# Patient Record
Sex: Female | Born: 2000 | Race: Black or African American | Hispanic: No | Smoking: Former smoker
Health system: Southern US, Community
[De-identification: ages and names within clinical notes are randomized; demographics above are authoritative.]

## PROBLEM LIST (undated history)

## (undated) DIAGNOSIS — M419 Scoliosis, unspecified: Secondary | ICD-10-CM

## (undated) DIAGNOSIS — O139 Gestational [pregnancy-induced] hypertension without significant proteinuria, unspecified trimester: Secondary | ICD-10-CM

## (undated) DIAGNOSIS — F32A Depression, unspecified: Secondary | ICD-10-CM

---
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
## 2020-02-09 ENCOUNTER — Other Ambulatory Visit: Payer: Self-pay

## 2020-02-09 ENCOUNTER — Ambulatory Visit (HOSPITAL_COMMUNITY)
Admission: EM | Admit: 2020-02-09 | Discharge: 2020-02-09 | Disposition: A | Discharge status: Home / Self Care | Payer: Medicaid Other | Attending: Physician Assistant | Admitting: Physician Assistant

## 2020-02-09 ENCOUNTER — Encounter (HOSPITAL_COMMUNITY): Payer: Self-pay | Admitting: Emergency Medicine

## 2020-02-09 DIAGNOSIS — M778 Other enthesopathies, not elsewhere classified: Secondary | ICD-10-CM

## 2020-02-09 MED ORDER — IBUPROFEN 600 MG PO TABS: 600.0000 mg | ORAL_TABLET | Freq: Four times a day (QID) | ORAL | 0 refills | Status: DC | PRN

## 2020-02-09 NOTE — ED Triage Notes (Signed)
Patient has been bowling for the past 2 weeks. Has noticed pain with these activities.  Pain in right middle finger.  Patient says she can move finger, but something does not feel right.  No known injury

## 2020-02-09 NOTE — ED Provider Notes (Signed)
[facility]    CSN: [id number] Arrival date & time: 02/09/20  1800      History   Chief Complaint Chief Complaint  Patient presents with  . Hand Pain    HPI Julia Phillips is a 19 y.o. female.   Patient reports her right middle finger discomfort.  She reports she has noticed this after bowling more recently.  She reports she believes there is mild swelling.  She is concerned was going on.  Denies numbness or tingling.  She has not tried any medicines for this.     Past Medical History:  Diagnosis Date  . Scoliosis     There are no problems to display for this patient.   History reviewed. No pertinent surgical history.  OB History    Gravida  0   Para  0   Term  0   Preterm  0   AB  0   Living  0     SAB  0   TAB  0   Ectopic  0   Multiple  0   Live Births  0            Home Medications    Prior to Admission medications   Medication Sig Start Date End Date Taking? Authorizing Provider  ibuprofen (ADVIL) 600 MG tablet Take 1 tablet (600 mg total) by mouth every 6 (six) hours as needed for moderate pain. 02/09/20   Lincoln Ginley, Veryl Speak, PA-C    Family History Family History  Problem Relation Age of Onset  . Diabetes Paternal Grandmother     Social History Social History   Tobacco Use  . Smoking status: Passive Smoke Exposure - Never Smoker  . Smokeless tobacco: Never Used  Substance Use Topics  . Alcohol use: Yes    Comment: rare  . Drug use: Yes    Types: Marijuana     Allergies   Patient has no known allergies.   Review of Systems Review of Systems   Physical Exam Triage Vital Signs ED Triage Vitals  Enc Vitals Group     BP 02/09/20 1849 139/84     Pulse Rate 02/09/20 1849 76     Resp 02/09/20 1849 18     Temp 02/09/20 1849 98.5 F (36.9 C)     Temp Source 02/09/20 1849 Oral     SpO2 02/09/20 1849 100 %     Weight --      Height --      Head Circumference --      Peak Flow --      Pain Score  02/09/20 1847 6     Pain Loc --      Pain Edu? --      Excl. in GC? --    No data found.  Updated Vital Signs BP 139/84 (BP Location: Right Arm)   Pulse 76   Temp 98.5 F (36.9 C) (Oral)   Resp 18   SpO2 100%   Visual Acuity Right Eye Distance:   Left Eye Distance:   Bilateral Distance:    Right Eye Near:   Left Eye Near:    Bilateral Near:     Physical Exam Vitals and nursing note reviewed.  Constitutional:      Appearance: Normal appearance.  Musculoskeletal:     Comments: Right middle finger with minimal to mild swelling about the PIP.  There is tenderness on the PIP on the pointer finger side.  Patient has full range  of motion.  Good strength.  PIP is stable with lateral and medial stress.  No crepitus appreciated.  Cap refill less than 2 seconds.  Neurological:     Mental Status: She is alert.      UC Treatments / Results  Labs (all labs ordered are listed, but only abnormal results are displayed) Labs Reviewed - No data to display  EKG   Radiology No results found.  Procedures Procedures (including critical care time)  Medications Ordered in UC Medications - No data to display  Initial Impression / Assessment and Plan / UC Course  I have reviewed the triage vital signs and the nursing notes.  Pertinent labs & imaging results that were available during my care of the patient were reviewed by me and considered in my medical decision making (see chart for details).     #Finger tendinitis Patient is a 19 year old with what appears to be a tendinopathy of the right middle.  Likely secondary to acute overuse bowling.  Reassured patient that I do not think there is anything seriously wrong with the finger and that simple rest and NSAID therapy will solve the problem.  Discussed reducing bowling time and to do basic mobility of the hand.  Discussed sports medicine follow-up if not improving over the next 1 to 2 weeks.  Patient verbalized understanding of  plan of care. -Ibuprofen every 6 hours Final Clinical Impressions(s) / UC Diagnoses   Final diagnoses:  Tendonitis of finger     Discharge Instructions     Take the ibuprofen every 6 hours, or with meals 3 times a day  Ice the finger tonight.  Consider limiting bowling activities  If not improved in next 1-2 weeks, consider sports medicine follow up or with your PCP       ED Prescriptions    Medication Sig Dispense Auth. Provider   ibuprofen (ADVIL) 600 MG tablet Take 1 tablet (600 mg total) by mouth every 6 (six) hours as needed for moderate pain. 30 tablet Oday [name], [name] Beards, PA-C     PDMP not reviewed this encounter.   Purnell Shoemaker, PA-C 02/09/20 2358

## 2020-02-09 NOTE — Discharge Instructions (Signed)
Take the ibuprofen every 6 hours, or with meals 3 times a day  Ice the finger tonight.  Consider limiting bowling activities  If not improved in next 1-2 weeks, consider sports medicine follow up or with your PCP

## 2021-03-04 ENCOUNTER — Encounter (HOSPITAL_COMMUNITY): Payer: Self-pay

## 2021-03-04 ENCOUNTER — Other Ambulatory Visit: Payer: Self-pay

## 2021-03-04 ENCOUNTER — Ambulatory Visit (HOSPITAL_COMMUNITY)
Admission: EM | Admit: 2021-03-04 | Discharge: 2021-03-04 | Disposition: A | Discharge status: Home / Self Care | Payer: Medicaid Other

## 2021-03-04 DIAGNOSIS — M25532 Pain in left wrist: Secondary | ICD-10-CM | POA: Diagnosis not present

## 2021-03-04 DIAGNOSIS — M67432 Ganglion, left wrist: Secondary | ICD-10-CM | POA: Diagnosis not present

## 2021-03-04 NOTE — Discharge Instructions (Addendum)
Please request a consultation for suspected ganglion cyst with [facility].  Do not use any nonsteroidal anti-inflammatories (NSAIDs) like ibuprofen, Motrin, naproxen, Aleve, etc. which are all available over-the-counter.  Please just use Tylenol at a dose of 500mg -650mg  once every 6 hours as needed for your wrist pain, aches.

## 2021-03-04 NOTE — ED Triage Notes (Signed)
Pt presents with intermittent left wrist pain for about a month with no known injury.

## 2021-03-04 NOTE — ED Provider Notes (Signed)
  [facility]   MRN: [id number] DOB: 2000/08/20  Subjective:   Julia Phillips is a 20 y.o. female presenting for 1 month history of persistent left wrist and pain.  Denies fall, trauma, bruising, warmth, erythema.  She works in [employer], has to use her hands and wrist a lot.  Has not used any particular medications.  Patient is pregnant.  No current facility-administered medications for this encounter.  Current Outpatient Medications:    ibuprofen (ADVIL) 600 MG tablet, Take 1 tablet (600 mg total) by mouth every 6 (six) hours as needed for moderate pain., Disp: 30 tablet, Rfl: 0   No Known Allergies  Past Medical History:  Diagnosis Date   Scoliosis      History reviewed. No pertinent surgical history.  Family History  Problem Relation Age of Onset   Diabetes Paternal Grandmother     Social History   Tobacco Use   Smoking status: Passive Smoke Exposure - Never Smoker   Smokeless tobacco: Never  Substance Use Topics   Alcohol use: Yes    Comment: rare   Drug use: Yes    Types: Marijuana    ROS   Objective:   Vitals: BP 138/81 (BP Location: Left Arm)   Pulse 80   Temp 99.1 F (37.3 C) (Oral)   Resp 18   SpO2 99%   Physical Exam Constitutional:      General: She is not in acute distress.    Appearance: Normal appearance. She is well-developed. She is not ill-appearing, toxic-appearing or diaphoretic.  HENT:     Head: Normocephalic and atraumatic.     Nose: Nose normal.     Mouth/Throat:     Mouth: Mucous membranes are moist.     Pharynx: Oropharynx is clear.  Eyes:     General: No scleral icterus.    Extraocular Movements: Extraocular movements intact.     Pupils: Pupils are equal, round, and reactive to light.  Cardiovascular:     Rate and Rhythm: Normal rate.  Pulmonary:     Effort: Pulmonary effort is normal.  Musculoskeletal:     Left wrist: Swelling (firm ~1/2cm nodule) and tenderness (focal over area outlined)  present. No deformity, effusion, lacerations, bony tenderness, snuff box tenderness or crepitus. Normal range of motion.       Arms:  Skin:    General: Skin is warm.  Neurological:     General: No focal deficit present.     Mental Status: She is alert and oriented to person, place, and time.  Psychiatric:        Mood and Affect: Mood normal.        Behavior: Behavior normal.        Thought Content: Thought content normal.        Judgment: Judgment normal.     Assessment and Plan :   PDMP not reviewed this encounter.  1. Acute pain of left wrist   2. Ganglion of left wrist     Suspect a ganglion cyst of the left wrist. Low suspicion for malignancy, recommended conservative management.  As she is pregnant, will defer imaging, use Tylenol for pain control.  I applied a 2 inch Ace wrap to the left wrist.  Follow-up with orthopedist. Counseled patient on potential for adverse effects with medications prescribed/recommended today, ER and return-to-clinic precautions discussed, patient verbalized understanding.    Wallis Bamberg, PA-C 03/04/21 1044

## 2021-03-28 ENCOUNTER — Telehealth (INDEPENDENT_AMBULATORY_CARE_PROVIDER_SITE_OTHER): Payer: Medicaid Other

## 2021-03-28 DIAGNOSIS — O219 Vomiting of pregnancy, unspecified: Secondary | ICD-10-CM

## 2021-03-28 DIAGNOSIS — M419 Scoliosis, unspecified: Secondary | ICD-10-CM

## 2021-03-28 DIAGNOSIS — Z3401 Encounter for supervision of normal first pregnancy, first trimester: Secondary | ICD-10-CM | POA: Insufficient documentation

## 2021-03-28 DIAGNOSIS — Z3A00 Weeks of gestation of pregnancy not specified: Secondary | ICD-10-CM

## 2021-03-28 MED ORDER — PROMETHAZINE HCL 25 MG PO TABS: 25.0000 mg | ORAL_TABLET | Freq: Four times a day (QID) | ORAL | 0 refills | Status: DC | PRN

## 2021-03-28 MED ORDER — BLOOD PRESSURE KIT DEVI: 1.0000 | Freq: Once | 0 refills | Status: AC

## 2021-03-28 NOTE — Progress Notes (Signed)
New OB Intake  I connected with  Julia Phillips on 03/29/21 at  2:15 PM EDT by MyChart Video Visit and verified that I am speaking with the correct person using two identifiers. Nurse is located at [facility] and pt is located at home.  I discussed the limitations, risks, security and privacy concerns of performing an evaluation and management service by telephone and the availability of in person appointments. I also discussed with the patient that there may be a patient responsible charge related to this service. The patient expressed understanding and agreed to proceed.  I explained I am completing New OB Intake today. We discussed her EDD of 10/09/21 that is based on LMP of 01/02/21. Pt is G1/P0. I reviewed her allergies, medications, Medical/Surgical/OB history, and appropriate screenings. I informed her of [facility] services. Based on history, this is an uncomplicated pregnancy.  Patient Active Problem List   Diagnosis Date Noted   Nausea and vomiting during pregnancy 03/29/2021   Scoliosis 03/29/2021   Supervision of low-risk first pregnancy, first trimester 03/28/2021    Concerns addressed today Nausea: currently smoking marijuana to increase appetite and to treat nausea. Pt would like rx for nausea medication; Phenergan sent per protocol. Explained pt may speak with provider at new OB appt about risk vs benefit of marijuana use during pregnancy.  Concerned about prior vitamin D deficiency.   Delivery Plans:  Plans to deliver at [facility].   MyChart/Babyscripts MyChart access verified. Babyscripts instructions given and order placed. Patient verifies receipt of registration text/e-mail. Account successfully created and app downloaded.  Blood Pressure Cuff  Blood pressure cuff ordered for patient to pick-up from [employer]. Explained after first prenatal appt pt will check weekly and document in Babyscripts.  Weight scale: Has weight scale   Anatomy US Explained first scheduled  [place] will be around 19 weeks. Anatomy US scheduled for 05/15/21 at 0830. Pt notified to arrive at 0815.  Labs Discussed Avelina Laine genetic screening with patient. Would like both Panorama and Horizon drawn at new OB visit. Routine prenatal labs needed.  Covid Vaccine Patient has not covid vaccine.   Mother/ Baby Dyad Candidate?    Yes, patient would like to be contacted for new appt.  Social Determinants of Health Food Insecurity: Patient expresses food insecurity. Food Market information given to patient; explained patient may visit at the end of first OB appointment. WIC Referral: Patient is interested in referral to [facility].  Transportation: Patient denies transportation needs. Childcare: Discussed no children allowed at ultrasound appointments. Offered childcare services; patient declines childcare services at this time.  First visit review I reviewed new OB appt with pt. I explained she will have a visit with provider that includes physical exam and OB labs. Explained pt will be seen by Merian Capron, MD at first visit; encounter routed to appropriate provider. Explained that patient will be seen by pregnancy navigator following visit with provider.  Marjo Bicker, RN 03/29/2021  12:13 PM

## 2021-03-29 DIAGNOSIS — M419 Scoliosis, unspecified: Secondary | ICD-10-CM | POA: Insufficient documentation

## 2021-03-29 DIAGNOSIS — O219 Vomiting of pregnancy, unspecified: Secondary | ICD-10-CM | POA: Insufficient documentation

## 2021-04-04 ENCOUNTER — Other Ambulatory Visit: Payer: Self-pay | Admitting: General Practice

## 2021-04-04 DIAGNOSIS — Z3401 Encounter for supervision of normal first pregnancy, first trimester: Secondary | ICD-10-CM

## 2021-04-04 MED ORDER — BLOOD PRESSURE KIT
1.0000 | PACK | Freq: Once | 0 refills | Status: AC
Start: 2021-04-04 — End: 2021-04-04

## 2021-04-16 ENCOUNTER — Encounter: Payer: Medicaid Other | Admitting: Family Medicine

## 2021-04-23 ENCOUNTER — Other Ambulatory Visit: Payer: Self-pay

## 2021-04-23 ENCOUNTER — Other Ambulatory Visit (HOSPITAL_COMMUNITY)
Admission: RE | Admit: 2021-04-23 | Discharge: 2021-04-23 | Disposition: A | Discharge status: Home / Self Care | Payer: Medicaid Other | Source: Ambulatory Visit | Attending: Family Medicine | Admitting: Family Medicine

## 2021-04-23 ENCOUNTER — Ambulatory Visit (INDEPENDENT_AMBULATORY_CARE_PROVIDER_SITE_OTHER): Payer: Medicaid Other | Admitting: Family Medicine

## 2021-04-23 VITALS — BP 134/81 | HR 86 | Wt 215.1 lb

## 2021-04-23 DIAGNOSIS — Z3401 Encounter for supervision of normal first pregnancy, first trimester: Secondary | ICD-10-CM

## 2021-04-23 DIAGNOSIS — Z5941 Food insecurity: Secondary | ICD-10-CM

## 2021-04-23 DIAGNOSIS — M419 Scoliosis, unspecified: Secondary | ICD-10-CM

## 2021-04-23 MED ORDER — BLOOD PRESSURE KIT DEVI: 1.0000 | 0 refills | Status: DC

## 2021-04-23 NOTE — Patient Instructions (Signed)
Second Trimester of Pregnancy °The second trimester of pregnancy is from week 13 through week 27. This is months 4 through 6 of pregnancy. The second trimester is often a time when you feel your best. Your body has adjusted to being pregnant, and you begin to feel better physically. °During the second trimester: °Morning sickness has lessened or stopped completely. °You may have more energy. °You may have an increase in appetite. °The second trimester is also a time when the unborn baby (fetus) is growing rapidly. At the end of the sixth month, the fetus may be up to 12 inches long and weigh about 1½ pounds. You will likely begin to feel the baby move (quickening) between 16 and 20 weeks of pregnancy. °Body changes during your second trimester °Your body continues to go through many changes during your second trimester. The changes vary and generally return to normal after the baby is born. °Physical changes °Your weight will continue to increase. You will notice your lower abdomen bulging out. °You may begin to get stretch marks on your hips, abdomen, and breasts. °Your breasts will continue to grow and to become tender. °Dark spots or blotches (chloasma or mask of pregnancy) may develop on your face. °A dark line from your belly button to the pubic area (linea nigra) may appear. °You may have changes in your hair. These can include thickening of your hair, rapid growth, and changes in texture. Some people also have hair loss during or after pregnancy, or hair that feels dry or thin. °Health changes °You may develop headaches. °You may have heartburn. °You may develop constipation. °You may develop hemorrhoids or swollen, bulging veins (varicose veins). °Your gums may bleed and may be sensitive to brushing and flossing. °You may urinate more often because the fetus is pressing on your bladder. °You may have back pain. This is caused by: °Weight gain. °Pregnancy hormones that are relaxing the joints in your  pelvis. °A shift in weight and the muscles that support your balance. °Follow these instructions at home: °Medicines °Follow your health care provider's instructions regarding medicine use. Specific medicines may be either safe or unsafe to take during pregnancy. Do not take any medicines unless approved by your health care provider. °Take a prenatal vitamin that contains at least 600 micrograms (mcg) of folic acid. °Eating and drinking °Eat a healthy diet that includes fresh fruits and vegetables, whole grains, good sources of protein such as meat, eggs, or tofu, and low-fat dairy products. °Avoid raw meat and unpasteurized juice, milk, and cheese. These carry germs that can harm you and your baby. °You may need to take these actions to prevent or treat constipation: °Drink enough fluid to keep your urine pale yellow. °Eat foods that are high in fiber, such as beans, whole grains, and fresh fruits and vegetables. °Limit foods that are high in fat and processed sugars, such as fried or sweet foods. °Activity °Exercise only as directed by your health care provider. Most people can continue their usual exercise routine during pregnancy. Try to exercise for 30 minutes at least 5 days a week. Stop exercising if you develop contractions in your uterus. °Stop exercising if you develop pain or cramping in the lower abdomen or lower back. °Avoid exercising if it is very hot or humid or if you are at a high altitude. °Avoid heavy lifting. °If you choose to, you may have sex unless your health care provider tells you not to. °Relieving pain and discomfort °Wear a supportive bra   to prevent discomfort from breast tenderness. °Take warm sitz baths to soothe any pain or discomfort caused by hemorrhoids. Use hemorrhoid cream if your health care provider approves. °Rest with your legs raised (elevated) if you have leg cramps or low back pain. °If you develop varicose veins: °Wear support hose as told by your health care  provider. °Elevate your feet for 15 minutes, 3-4 times a day. °Limit salt in your diet. °Safety °Wear your seat belt at all times when driving or riding in a car. °Talk with your health care provider if someone is verbally or physically abusive to you. °Lifestyle °Do not use hot tubs, steam rooms, or saunas. °Do not douche. Do not use tampons or scented sanitary pads. °Avoid cat litter boxes and soil used by cats. These carry germs that can cause birth defects in the baby and possibly loss of the fetus by miscarriage or stillbirth. °Do not use herbal remedies, alcohol, illegal drugs, or medicines that are not approved by your health care provider. Chemicals in these products can harm your baby. °Do not use any products that contain nicotine or tobacco, such as cigarettes, e-cigarettes, and chewing tobacco. If you need help quitting, ask your health care provider. °General instructions °During a routine prenatal visit, your health care provider will do a physical exam and other tests. He or she will also discuss your overall health. Keep all follow-up visits. This is important. °Ask your health care provider for a referral to a local prenatal education class. °Ask for help if you have counseling or nutritional needs during pregnancy. Your health care provider can offer advice or refer you to specialists for help with various needs. °Where to find more information °American Pregnancy Association: americanpregnancy.org °American College of Obstetricians and Gynecologists: acog.org/en/Womens%20Health/Pregnancy °Office on Women's Health: womenshealth.gov/pregnancy °Contact a health care provider if you have: °A headache that does not go away when you take medicine. °Vision changes or you see spots in front of your eyes. °Mild pelvic cramps, pelvic pressure, or nagging pain in the abdominal area. °Persistent nausea, vomiting, or diarrhea. °A bad-smelling vaginal discharge or foul-smelling urine. °Pain when you  urinate. °Sudden or extreme swelling of your face, hands, ankles, feet, or legs. °A fever. °Get help right away if you: °Have fluid leaking from your vagina. °Have spotting or bleeding from your vagina. °Have severe abdominal cramping or pain. °Have difficulty breathing. °Have chest pain. °Have fainting spells. °Have not felt your baby move for the time period told by your health care provider. °Have new or increased pain, swelling, or redness in an arm or leg. °Summary °The second trimester of pregnancy is from week 13 through week 27 (months 4 through 6). °Do not use herbal remedies, alcohol, illegal drugs, or medicines that are not approved by your health care provider. Chemicals in these products can harm your baby. °Exercise only as directed by your health care provider. Most people can continue their usual exercise routine during pregnancy. °Keep all follow-up visits. This is important. °This information is not intended to replace advice given to you by your health care provider. Make sure you discuss any questions you have with your health care provider. °Document Revised: 01/11/2020 Document Reviewed: 11/17/2019 °Elsevier Patient Education © 2022 [employer] Inc. ° °Contraception Choices °Contraception, also called birth control, refers to methods or devices that prevent pregnancy. °Hormonal methods °Contraceptive implant °A contraceptive implant is a thin, plastic tube that contains a hormone that prevents pregnancy. It is different from an intrauterine device (IUD). It   is inserted into the upper part of the arm by a health care provider. Implants can be effective for up to 3 years. °Progestin-only injections °Progestin-only injections are injections of progestin, a synthetic form of the hormone progesterone. They are given every 3 months by a health care provider. °Birth control pills °Birth control pills are pills that contain hormones that prevent pregnancy. They must be taken once a day, preferably at the  same time each day. A prescription is needed to use this method of contraception. °Birth control patch °The birth control patch contains hormones that prevent pregnancy. It is placed on the skin and must be changed once a week for three weeks and removed on the fourth week. A prescription is needed to use this method of contraception. °Vaginal ring °A vaginal ring contains hormones that prevent pregnancy. It is placed in the vagina for three weeks and removed on the fourth week. After that, the process is repeated with a new ring. A prescription is needed to use this method of contraception. °Emergency contraceptive °Emergency contraceptives prevent pregnancy after unprotected sex. They come in pill form and can be taken up to 5 days after sex. They work best the sooner they are taken after having sex. Most emergency contraceptives are available without a prescription. This method should not be used as your only form of birth control. °Barrier methods °Female condom °A female condom is a thin sheath that is worn over the penis during sex. Condoms keep sperm from going inside a woman's body. They can be used with a sperm-killing substance (spermicide) to increase their effectiveness. They should be thrown away after one use. °Female condom °A female condom is a soft, loose-fitting sheath that is put into the vagina before sex. The condom keeps sperm from going inside a woman's body. They should be thrown away after one use. °Diaphragm °A diaphragm is a soft, dome-shaped barrier. It is inserted into the vagina before sex, along with a spermicide. The diaphragm blocks sperm from entering the uterus, and the spermicide kills sperm. A diaphragm should be left in the vagina for 6-8 hours after sex and removed within 24 hours. °A diaphragm is prescribed and fitted by a health care provider. A diaphragm should be replaced every 1-2 years, after giving birth, after gaining more than 15 lb (6.8 kg), and after pelvic  surgery. °Cervical cap °A cervical cap is a round, soft latex or plastic cup that fits over the cervix. It is inserted into the vagina before sex, along with spermicide. It blocks sperm from entering the uterus. The cap should be left in place for 6-8 hours after sex and removed within 48 hours. A cervical cap must be prescribed and fitted by a health care provider. It should be replaced every 2 years. °Sponge °A sponge is a soft, circular piece of polyurethane foam with spermicide in it. The sponge helps block sperm from entering the uterus, and the spermicide kills sperm. To use it, you make it wet and then insert it into the vagina. It should be inserted before sex, left in for at least 6 hours after sex, and removed and thrown away within 30 hours. °Spermicides °Spermicides are chemicals that kill or block sperm from entering the cervix and uterus. They can come as a cream, jelly, suppository, foam, or tablet. A spermicide should be inserted into the vagina with an applicator at least 10-15 minutes before sex to allow time for it to work. The process must be repeated every time   you have sex. Spermicides do not require a prescription. °Intrauterine contraception °Intrauterine device (IUD) °An IUD is a T-shaped device that is put in a woman's uterus. There are two types: °Hormone IUD.This type contains progestin, a synthetic form of the hormone progesterone. This type can stay in place for 3-5 years. °Copper IUD.This type is wrapped in copper wire. It can stay in place for 10 years. °Permanent methods of contraception °Female tubal ligation °In this method, a woman's fallopian tubes are sealed, tied, or blocked during surgery to prevent eggs from traveling to the uterus. °Hysteroscopic sterilization °In this method, a small, flexible insert is placed into each fallopian tube. The inserts cause scar tissue to form in the fallopian tubes and block them, so sperm cannot reach an egg. The procedure takes about 3  months to be effective. Another form of birth control must be used during those 3 months. °Female sterilization °This is a procedure to tie off the tubes that carry sperm (vasectomy). After the procedure, the man can still ejaculate fluid (semen). Another form of birth control must be used for 3 months after the procedure. °Natural planning methods °Natural family planning °In this method, a couple does not have sex on days when the woman could become pregnant. °Calendar method °In this method, the woman keeps track of the length of each menstrual cycle, identifies the days when pregnancy can happen, and does not have sex on those days. °Ovulation method °In this method, a couple avoids sex during ovulation. °Symptothermal method °This method involves not having sex during ovulation. The woman typically checks for ovulation by watching changes in her temperature and in the consistency of cervical mucus. °Post-ovulation method °In this method, a couple waits to have sex until after ovulation. °Where to find more information °Centers for Disease Control and Prevention: www.cdc.gov °Summary °Contraception, also called birth control, refers to methods or devices that prevent pregnancy. °Hormonal methods of contraception include implants, injections, pills, patches, vaginal rings, and emergency contraceptives. °Barrier methods of contraception can include female condoms, female condoms, diaphragms, cervical caps, sponges, and spermicides. °There are two types of IUDs (intrauterine devices). An IUD can be put in a woman's uterus to prevent pregnancy for 3-5 years. °Permanent sterilization can be done through a procedure for males and females. Natural family planning methods involve nothaving sex on days when the woman could become pregnant. °This information is not intended to replace advice given to you by your health care provider. Make sure you discuss any questions you have with your health care provider. °Document  Revised: 01/09/2020 Document Reviewed: 01/09/2020 °Elsevier Patient Education © 2022 Elsevier Inc. ° °

## 2021-04-23 NOTE — Progress Notes (Signed)
    [facility]  Subjective:   RIGBY SWAMY is a 20 y.o. G1P0000 at [redacted]w[redacted]d by uncertain LMP (had nexplanon removed prior to getting pregnant) being seen today for her first obstetrical visit.  Her obstetrical history is significant for  n/a . Patient does intend to breast feed. Pregnancy history fully reviewed.  Patient reports no complaints.  HISTORY: OB History  Gravida Para Term Preterm AB Living  1 0 0 0 0 0  SAB IAB Ectopic Multiple Live Births  0 0 0 0 0    # Outcome Date GA Lbr Len/2nd Weight Sex Delivery Anes PTL Lv  1 Current             Last pap smear was  n/a, not yet due for screening.  Past Medical History:  Diagnosis Date   Scoliosis    History reviewed. No pertinent surgical history. Family History  Problem Relation Age of Onset   Diabetes Paternal Grandmother    Social History   Tobacco Use   Smoking status: Former    Types: E-cigarettes    Passive exposure: Yes   Smokeless tobacco: Never  Vaping Use   Vaping Use: Former  Substance Use Topics   Alcohol use: Not Currently    Comment: rare   Drug use: Yes    Types: Marijuana   No Known Allergies Current Outpatient Medications on File Prior to Visit  Medication Sig Dispense Refill   Prenatal 27-1 MG TABS Take 1 tablet by mouth daily.     promethazine (PHENERGAN) 25 MG tablet Take 1 tablet (25 mg total) by mouth every 6 (six) hours as needed for nausea or vomiting. 30 tablet 0   No current facility-administered medications on file prior to visit.     Exam   There were no vitals filed for this visit.    System: General: well-developed, well-nourished female in no acute distress   Skin: normal coloration and turgor, no rashes   Neurologic: oriented, normal, negative, normal mood   Extremities: normal strength, tone, and muscle mass, ROM of all joints is normal   HEENT PERRLA, extraocular movement intact and sclera clear, anicteric   Neck supple and no masses   Respiratory:  no  respiratory distress      Assessment:   Pregnancy: G1P0000 Patient Active Problem List   Diagnosis Date Noted   Nausea and vomiting during pregnancy 03/29/2021   Scoliosis 03/29/2021   Supervision of low-risk first pregnancy, first trimester 03/28/2021     Plan:  1. Supervision of low-risk first pregnancy, first trimester Initial labs drawn. Continue prenatal vitamins. Genetic Screening discussed, NIPS: ordered. Ultrasound discussed; fetal anatomic survey: already scheduled for 05/15/2021. Problem list reviewed and updated. The nature of [facility] clinic was explained to patient; may need to be seen by [facility] providers which includes multiple doctors and APPs; delivery will hopefully be with [facility] provider but good chance will be with [facility] provider; also emphasized that residents, students are part of our team.  2. Scoliosis, unspecified scoliosis type, unspecified spinal region Will message anesthesia to see if there is any issue No hx of surgery or rods Unlikely to be a significant problem   Routine obstetric precautions reviewed. Return in 4 weeks (on 05/21/2021) for [facility] patient, [facility], ob visit.

## 2021-04-24 LAB — CBC/D/PLT+RPR+RH+ABO+RUBIGG...
Antibody Screen: NEGATIVE
Basophils Absolute: 0 10*3/uL (ref 0.0–0.2)
Basos: 0 %
EOS (ABSOLUTE): 0.1 10*3/uL (ref 0.0–0.4)
Eos: 1 %
HCV Ab: <0.1 s/co ratio (ref 0.0–0.9)
HIV Screen 4th Generation wRfx: NONREACTIVE
Hematocrit: 36.8 % (ref 34.0–46.6)
Hemoglobin: 12.3 g/dL (ref 11.1–15.9)
Hepatitis B Surface Ag: NEGATIVE
Immature Grans (Abs): 0 10*3/uL (ref 0.0–0.1)
Immature Granulocytes: 0 %
Lymphocytes Absolute: 1.7 10*3/uL (ref 0.7–3.1)
Lymphs: 30 %
MCH: 29 pg (ref 26.6–33.0)
MCHC: 33.4 g/dL (ref 31.5–35.7)
MCV: 87 fL (ref 79–97)
Monocytes Absolute: 0.5 10*3/uL (ref 0.1–0.9)
Monocytes: 8 %
Neutrophils Absolute: 3.3 10*3/uL (ref 1.4–7.0)
Neutrophils: 61 %
Platelets: 275 10*3/uL (ref 150–450)
RBC: 4.24 x10E6/uL (ref 3.77–5.28)
RDW: 13.7 % (ref 11.7–15.4)
RPR Ser Ql: NONREACTIVE
Rh Factor: POSITIVE
Rubella Antibodies, IGG: 7.55 index (ref >0.99)
WBC: 5.6 10*3/uL (ref 3.4–10.8)

## 2021-04-24 LAB — HCV INTERPRETATION

## 2021-04-24 LAB — HEMOGLOBIN A1C
Est. average glucose Bld gHb Est-mCnc: 111 mg/dL
Hgb A1c MFr Bld: 5.5 % (ref 4.8–5.6)

## 2021-04-25 LAB — CULTURE, OB URINE

## 2021-04-25 LAB — GC/CHLAMYDIA PROBE AMP (~~LOC~~) NOT AT ARMC
Chlamydia: NEGATIVE
Comment: NEGATIVE
Comment: NORMAL
Neisseria Gonorrhea: NEGATIVE

## 2021-04-25 LAB — URINE CULTURE, OB REFLEX

## 2021-05-06 ENCOUNTER — Encounter: Payer: Self-pay | Admitting: General Practice

## 2021-05-13 ENCOUNTER — Ambulatory Visit: Payer: Medicaid Other | Admitting: Obstetrics and Gynecology

## 2021-05-15 ENCOUNTER — Ambulatory Visit: Payer: Medicaid Other | Attending: Family Medicine

## 2021-05-15 ENCOUNTER — Other Ambulatory Visit: Payer: Self-pay | Admitting: *Deleted

## 2021-05-15 ENCOUNTER — Other Ambulatory Visit: Payer: Self-pay

## 2021-05-15 ENCOUNTER — Other Ambulatory Visit: Payer: Self-pay | Admitting: Family Medicine

## 2021-05-15 DIAGNOSIS — Z362 Encounter for other antenatal screening follow-up: Secondary | ICD-10-CM

## 2021-05-15 DIAGNOSIS — Z3401 Encounter for supervision of normal first pregnancy, first trimester: Secondary | ICD-10-CM | POA: Diagnosis present

## 2021-05-15 IMAGING — US US MFM OB DETAIL+14 WK
1 series · 13 of 28 positions shown · non-contrast
Comparison: none

[Series 1: us mfm ob detail+14 wk · 13 of 93 slices shown]
[im 4/93]
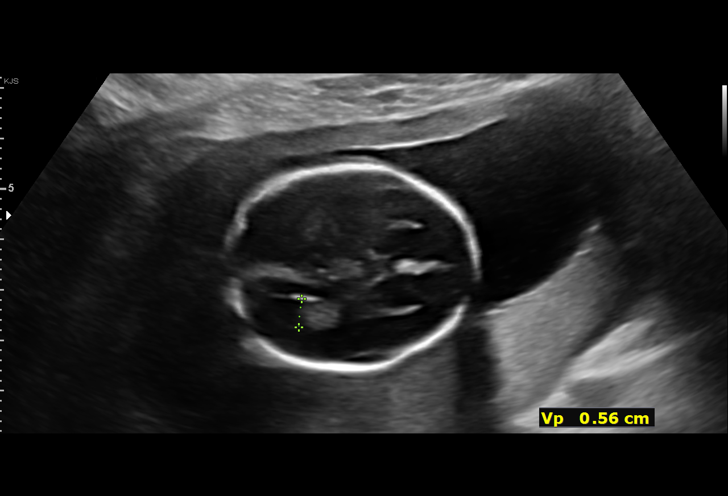
[im 11/93]
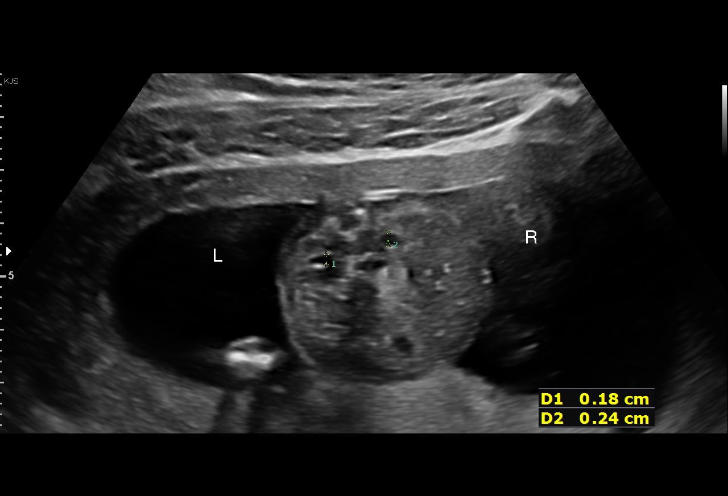
[im 18/93]
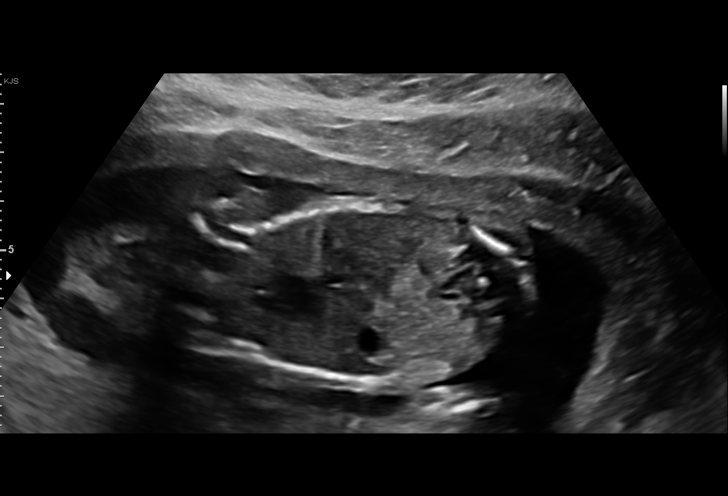
[im 24/93]
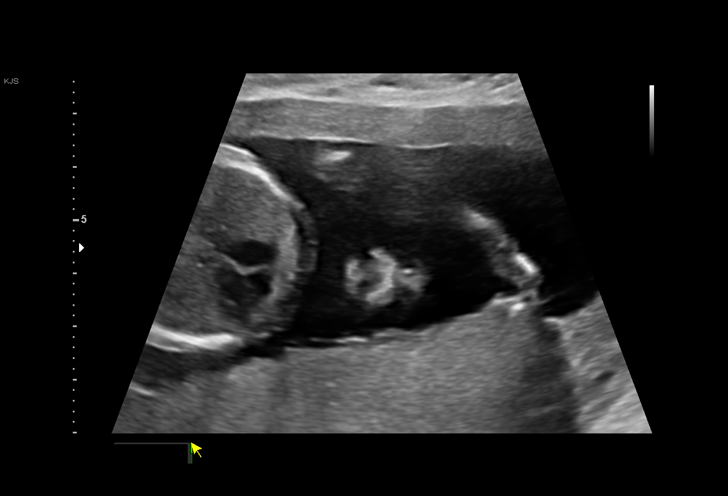
[im 31/93]
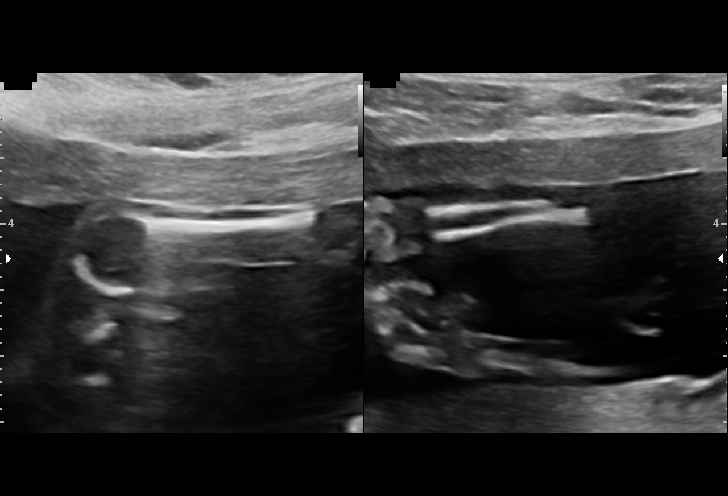
[im 38/93]
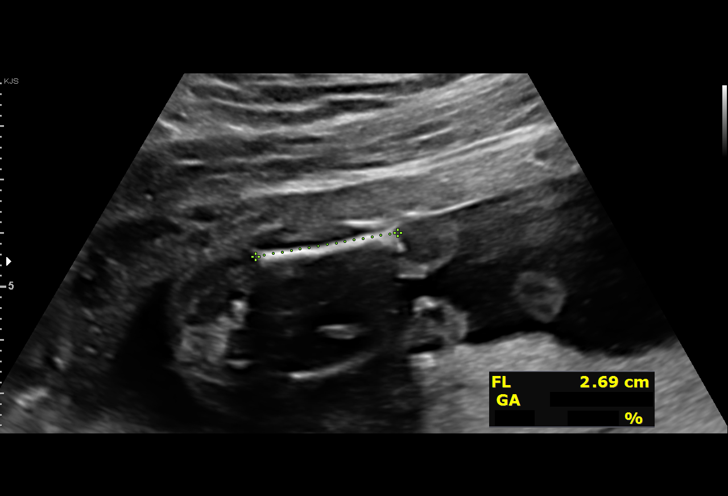
[im 48/93]
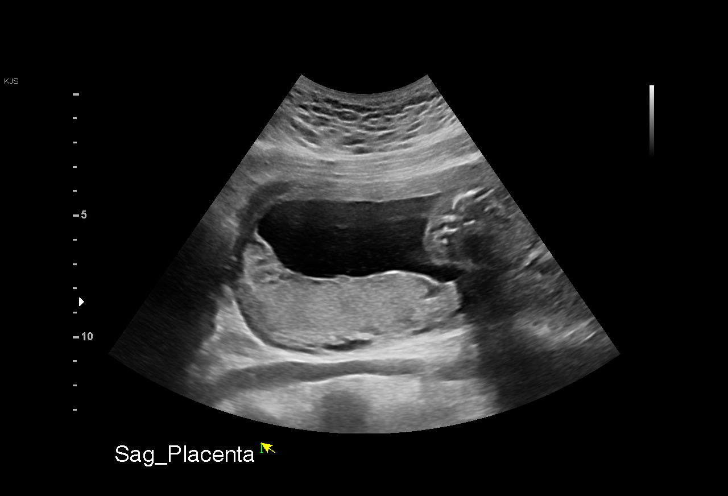
[im 55/93]
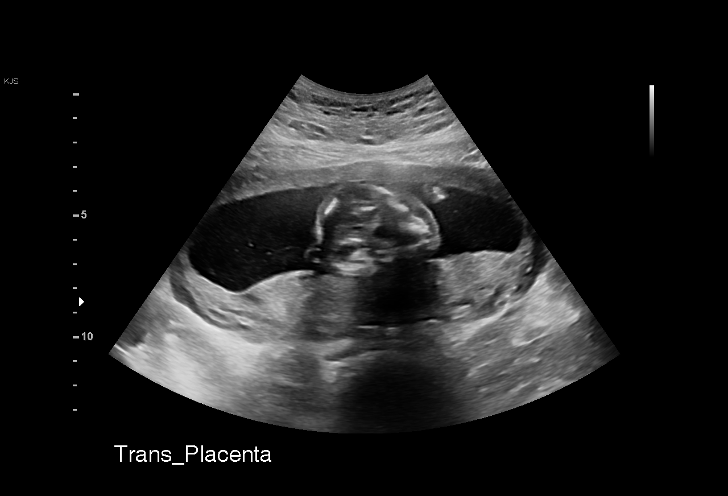
[im 62/93]
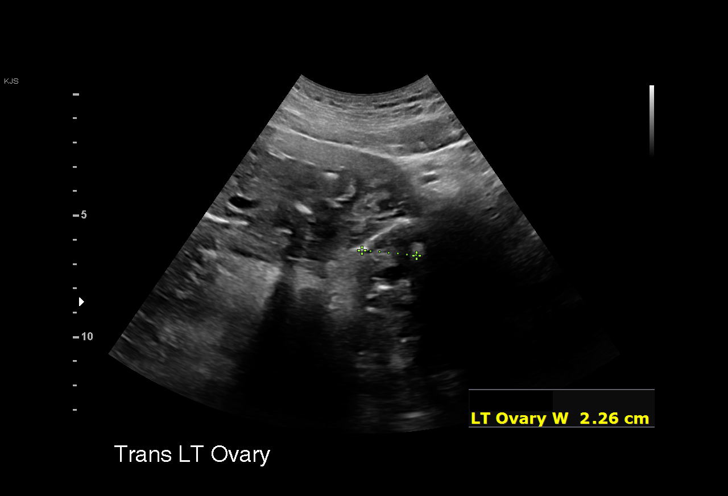
[im 69/93]
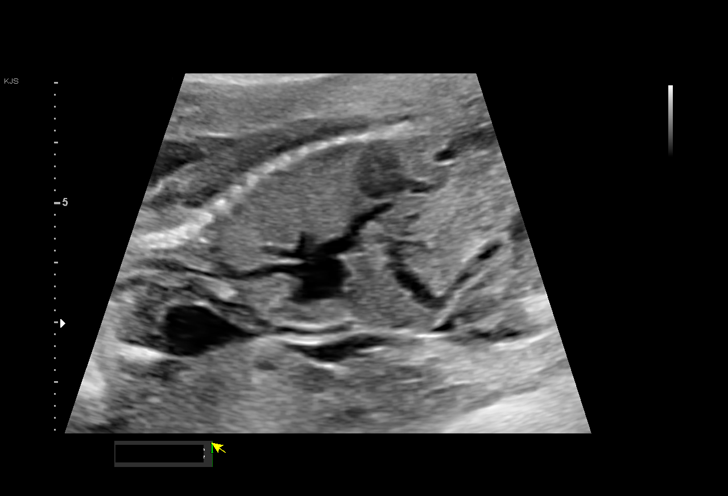
[im 75/93]
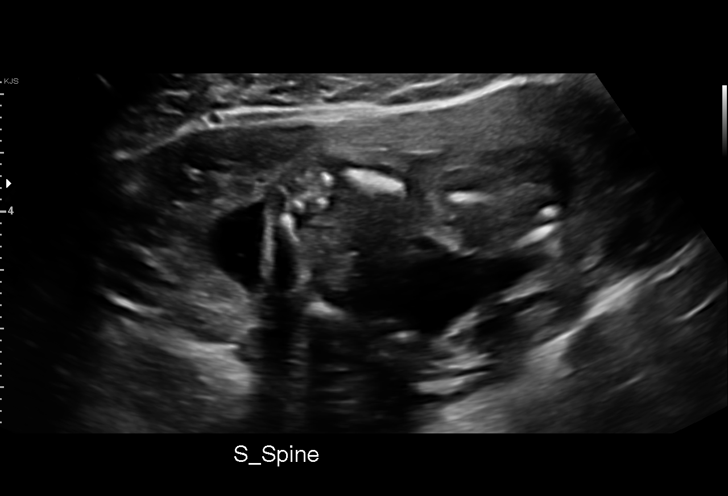
[im 82/93]
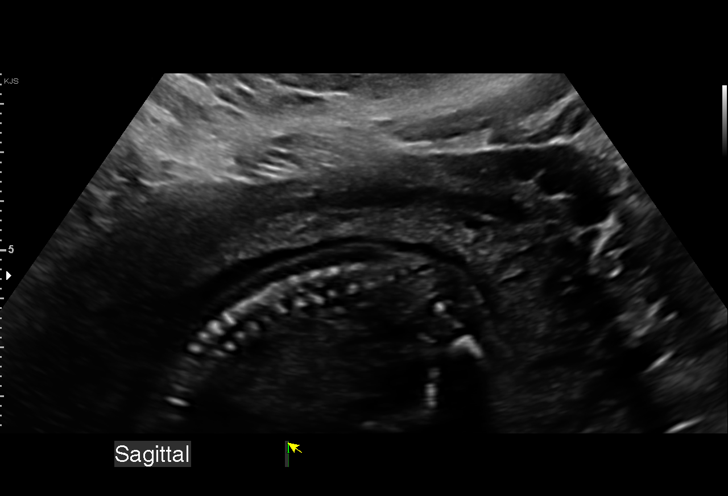
[im 89/93]
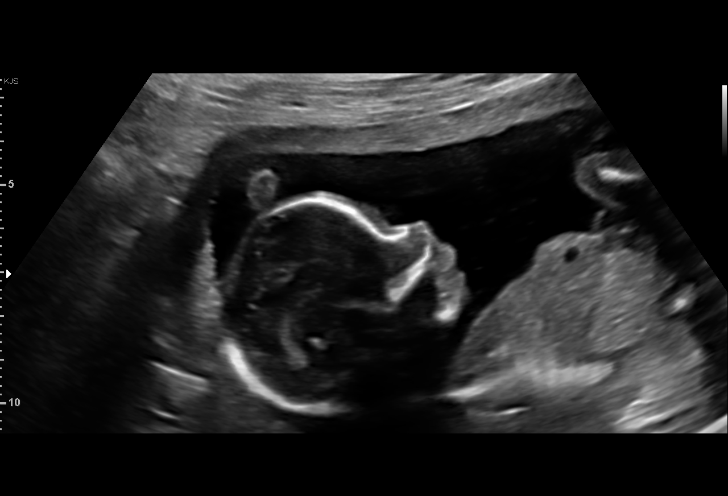

[13 of 28 positions shown; findings below may reference images not displayed]

Indications

 Obesity complicating pregnancy, second         [0G]
 trimester (BMI 35.8)
 Drug use complicating pregnancy, second        [0G]
 trimester (Marijuana)
 Encounter for antenatal screening for          [0G]
 malformations
 19 weeks gestation of pregnancy
 LR Panorama
Fetal Evaluation

 Num Of Fetuses:         1
 Fetal Heart Rate(bpm):  153
 Cardiac Activity:       Observed
 Presentation:           Breech
 Placenta:               Posterior Fundal
 P. Cord Insertion:      Visualized, central

 Amniotic Fluid
 AFI FV:      Within normal limits

                             Largest Pocket(cm)

Biometry
 BPD:      39.8  mm     G. Age:  18w 1d         15  %    CI:        70.83   %    70 - 86
                                                         FL/HC:      17.8   %    16.1 -
 HC:      150.7  mm     G. Age:  18w 1d          9  %    HC/AC:      1.15        1.09 -
 AC:      130.6  mm     G. Age:  18w 4d         32  %    FL/BPD:     67.3   %
 FL:       26.8  mm     G. Age:  18w 1d         16  %    FL/AC:      20.5   %    20 - 24
 HUM:      25.6  mm     G. Age:  18w 0d         23  %
 CER:      17.6  mm     G. Age:  17w 4d      < 2.3  %
 NFT:       2.3  mm

 LV:        5.6  mm
 CM:        5.3  mm

 Est. FW:     236  gm      0 lb 8 oz     15  %
Gestational Age

 LMP:           19w 0d        Date:  [DATE]                 EDD:   [DATE]
 U/S Today:     18w 2d                                        EDD:   [DATE]
 Best:          19w 0d     Det. By:  LMP  ([DATE])          EDD:   [DATE]
Anatomy

 Cranium:               Appears normal         LVOT:                   Not well visualized
 Cavum:                 Appears normal         Aortic Arch:            Not well visualized
 Ventricles:            Appears normal         Ductal Arch:            Not well visualized
 Choroid Plexus:        Appears normal         Diaphragm:              Appears normal
 Cerebellum:            Appears normal         Stomach:                Appears normal, left
                                                                       sided
 Posterior Fossa:       Appears normal         Abdomen:                Appears normal
 Nuchal Fold:           Appears normal         Abdominal Wall:         Appears nml (cord
                                                                       insert, abd wall)
 Face:                  Appears normal         Cord Vessels:           Appears normal (3
                        (orbits and profile)                           vessel cord)
 Lips:                  Appears normal         Kidneys:                Appear normal
 Palate:                Not well visualized    Bladder:                Appears normal
 Thoracic:              Appears normal         Spine:                  Appears normal
 Heart:                 Not well visualized    Upper Extremities:      Appears normal
 RVOT:                  Not well visualized    Lower Extremities:      Appears normal

 Other:  Fetus appears to be female. Technically difficult due to maternal
         habitus and early GA. Nasal bone, 5th digits, feet, lenses, 3VV
         visualized.
Targeted Anatomy

 Thorax
 SVC:                   Appears normal         IVC:                    Appears normal
Cervix Uterus Adnexa

 Cervix
 Length:           3.97  cm.
 Normal appearance by transabdominal scan.

 Uterus
 No abnormality visualized.
 Right Ovary
 Size(cm)       3.6  x   2.3    x  2.6       Vol(ml):
 Within normal limits.

 Left Ovary
 Size(cm)       4.2  x   2.3    x  2.3       Vol(ml):
 Within normal limits.

 Cul De Sac
 No free fluid seen.

 Adnexa
 No adnexal mass visualized.
Impression

 G1 P0. Patient is here for fetal anatomy scan.
 On cell-free fetal DNA screening, the risks of fetal
 aneuploidies are not increased .

 We performed fetal anatomy scan. No makers of
 aneuploidies or fetal structural defects are seen. Fetal
 biometry is consistent with her previously-established dates.
 Amniotic fluid is normal and good fetal activity is seen.
 Patient understands the limitations of ultrasound in detecting
 fetal anomalies.
 As maternal obesity limits resolution of images, failure to
 detect anomalies are more common .
Recommendations

 -An appointment was made for her to return in 4 weeks for
 completion of fetal anatomy (cardiac anatomy).
                 COHUO

## 2021-05-24 ENCOUNTER — Other Ambulatory Visit: Payer: Self-pay

## 2021-05-24 ENCOUNTER — Ambulatory Visit (INDEPENDENT_AMBULATORY_CARE_PROVIDER_SITE_OTHER): Payer: Medicaid Other | Admitting: Family Medicine

## 2021-05-24 VITALS — BP 129/88 | HR 90 | Wt 210.4 lb

## 2021-05-24 DIAGNOSIS — Z3401 Encounter for supervision of normal first pregnancy, first trimester: Secondary | ICD-10-CM

## 2021-05-24 DIAGNOSIS — R0981 Nasal congestion: Secondary | ICD-10-CM

## 2021-05-24 MED ORDER — FLUTICASONE PROPIONATE 50 MCG/ACT NA SUSP: 1.0000 | Freq: Every day | NASAL | 2 refills | Status: DC

## 2021-05-24 NOTE — Patient Instructions (Signed)

## 2021-05-24 NOTE — Progress Notes (Signed)
   Subjective:  Julia Phillips is a 20 y.o. G1P0000 at [redacted]w[redacted]d being seen today for ongoing prenatal care.  She is currently monitored for the following issues for this low-risk pregnancy and has Supervision of low-risk first pregnancy, first trimester; Nausea and vomiting during pregnancy; and Scoliosis on their problem list.  Patient reports  sinus infection, has been taking amoxicillin .  Contractions: Not present. Vag. Bleeding: None.  Movement: Present. Denies leaking of fluid.   The following portions of the patient's history were reviewed and updated as appropriate: allergies, current medications, past family history, past medical history, past social history, past surgical history and problem list. Problem list updated.  Objective:   Vitals:   05/24/21 0829  BP: 129/88  Pulse: 90  Weight: 210 lb 6.4 oz (95.4 kg)    Fetal Status: Fetal Heart Rate (bpm): 160   Movement: Present     General:  Alert, oriented and cooperative. Patient is in no acute distress.  Skin: Skin is warm and dry. No rash noted.   Cardiovascular: Normal heart rate noted  Respiratory: Normal respiratory effort, no problems with respiration noted  Abdomen: Soft, gravid, appropriate for gestational age. Pain/Pressure: Absent     Pelvic: Vag. Bleeding: None     Cervical exam deferred        Extremities: Normal range of motion.  Edema: None  Mental Status: Normal mood and affect. Normal behavior. Normal judgment and thought content.   Urinalysis:      Assessment and Plan:  Pregnancy: G1P0000 at [redacted]w[redacted]d  1. Supervision of low-risk first pregnancy, first trimester BP and FHR normal AFP today Elevated PHQ-9 score, reports some mood swings but overall feels fine, declines BH today Trial flonase for nasal congestion  Preterm labor symptoms and general obstetric precautions including but not limited to vaginal bleeding, contractions, leaking of fluid and fetal movement were reviewed in detail with the  patient. Please refer to After Visit Summary for other counseling recommendations.  Return in 4 weeks (on 06/21/2021) for [facility] patient, ob visit.   Venora Maples, MD

## 2021-05-26 LAB — AFP, SERUM, OPEN SPINA BIFIDA
AFP MoM: 1.38
AFP Value: 72 ng/mL
Gest. Age on Collection Date: 20 weeks
Maternal Age At EDD: 21.3 yr
OSBR Risk 1 IN: 7620
Test Results:: NEGATIVE
Weight: 215 [lb_av]

## 2021-06-12 ENCOUNTER — Ambulatory Visit: Payer: Medicaid Other

## 2021-06-21 ENCOUNTER — Telehealth (INDEPENDENT_AMBULATORY_CARE_PROVIDER_SITE_OTHER): Payer: Medicaid Other | Admitting: Family Medicine

## 2021-06-21 ENCOUNTER — Other Ambulatory Visit: Payer: Self-pay

## 2021-06-21 DIAGNOSIS — Z3A24 24 weeks gestation of pregnancy: Secondary | ICD-10-CM

## 2021-06-21 DIAGNOSIS — O99612 Diseases of the digestive system complicating pregnancy, second trimester: Secondary | ICD-10-CM

## 2021-06-21 DIAGNOSIS — K219 Gastro-esophageal reflux disease without esophagitis: Secondary | ICD-10-CM

## 2021-06-21 DIAGNOSIS — Z3401 Encounter for supervision of normal first pregnancy, first trimester: Secondary | ICD-10-CM

## 2021-06-21 MED ORDER — PANTOPRAZOLE SODIUM 20 MG PO TBEC
20.0000 mg | DELAYED_RELEASE_TABLET | Freq: Every day | ORAL | 5 refills | Status: DC
Start: 2021-06-21 — End: 2021-07-26

## 2021-06-21 NOTE — Patient Instructions (Addendum)
Safe Medications in Pregnancy   Acne:  Benzoyl Peroxide  Salicylic Acid   Backache/Headache:  Tylenol: 2 regular strength every 4 hours OR               2 Extra strength every 6 hours   Colds/Coughs/Allergies:  Benadryl (alcohol free) 25 mg every 6 hours as needed  Breath right strips  Claritin  Cepacol throat lozenges  Chloraseptic throat spray  Cold-Eeze- up to three times per day  Cough drops, alcohol free  Flonase (by prescription only)  Guaifenesin  Mucinex  Robitussin DM (plain only, alcohol free)  Saline nasal spray/drops  Sudafed (pseudoephedrine) & Actifed * use only after [redacted] weeks gestation and if you do not have high blood pressure  Tylenol  Vicks Vaporub  Zinc lozenges  Zyrtec   Constipation:  Colace  Ducolax suppositories  Fleet enema  Glycerin suppositories  Metamucil  Milk of magnesia  Miralax  Senokot  Smooth move tea   Diarrhea:  Kaopectate  Imodium A-D   *NO pepto Bismol   Hemorrhoids:  Anusol  Anusol HC  Preparation H  Tucks   Indigestion:  Tums  Maalox  Mylanta  Zantac  Pepcid   Insomnia:  Benadryl (alcohol free) 25mg  every 6 hours as needed  Tylenol PM  Unisom, no Gelcaps   Leg Cramps:  Tums  MagGel   Nausea/Vomiting:  Bonine  Dramamine  Emetrol  Ginger extract  Sea bands  Meclizine  Nausea medication to take during pregnancy:  Unisom (doxylamine succinate 25 mg tablets) Take one tablet daily at bedtime. If symptoms are not adequately controlled, the dose can be increased to a maximum recommended dose of two tablets daily (1/2 tablet in the morning, 1/2 tablet mid-afternoon and one at bedtime).  Vitamin B6 100mg  tablets. Take one tablet twice a day (up to 200 mg per day).   Skin Rashes:  Aveeno products  Benadryl cream or 25mg  every 6 hours as needed  Calamine Lotion  1% cortisone cream   Yeast infection:  Gyne-lotrimin 7  Monistat 7    **If taking multiple medications, please check labels to avoid  duplicating the same active ingredients  **take medication as directed on the label  ** Do not exceed 4000 mg of tylenol in 24 hours  **Do not take medications that contain aspirin or ibuprofen           Contraception Choices Contraception, also called birth control, refers to methods or devices that prevent pregnancy. Hormonal methods Contraceptive implant A contraceptive implant is a thin, plastic tube that contains a hormone that prevents pregnancy. It is different from an intrauterine device (IUD). It is inserted into the upper part of the arm by a health care provider. Implants can be effective for up to 3 years. Progestin-only injections Progestin-only injections are injections of progestin, a synthetic form of the hormone progesterone. They are given every 3 months by a health care provider. Birth control pills Birth control pills are pills that contain hormones that prevent pregnancy. They must be taken once a day, preferably at the same time each day. A prescription is needed to use this method of contraception. Birth control patch The birth control patch contains hormones that prevent pregnancy. It is placed on the skin and must be changed once a week for three weeks and removed on the fourth week. A prescription is needed to use this method of contraception. Vaginal ring A vaginal ring contains hormones that prevent pregnancy. It is placed in the vagina  for three weeks and removed on the fourth week. After that, the process is repeated with a new ring. A prescription is needed to use this method of contraception. Emergency contraceptive Emergency contraceptives prevent pregnancy after unprotected sex. They come in pill form and can be taken up to 5 days after sex. They work best the sooner they are taken after having sex. Most emergency contraceptives are available without a prescription. This method should not be used as your only form of birth control. Barrier methods Female  condom A female condom is a thin sheath that is worn over the penis during sex. Condoms keep sperm from going inside a woman's body. They can be used with a sperm-killing substance (spermicide) to increase their effectiveness. They should be thrown away after one use. Female condom A female condom is a soft, loose-fitting sheath that is put into the vagina before sex. The condom keeps sperm from going inside a woman's body. They should be thrown away after one use. Diaphragm A diaphragm is a soft, dome-shaped barrier. It is inserted into the vagina before sex, along with a spermicide. The diaphragm blocks sperm from entering the uterus, and the spermicide kills sperm. A diaphragm should be left in the vagina for 6-8 hours after sex and removed within 24 hours. A diaphragm is prescribed and fitted by a health care provider. A diaphragm should be replaced every 1-2 years, after giving birth, after gaining more than 15 lb (6.8 kg), and after pelvic surgery. Cervical cap A cervical cap is a round, soft latex or plastic cup that fits over the cervix. It is inserted into the vagina before sex, along with spermicide. It blocks sperm from entering the uterus. The cap should be left in place for 6-8 hours after sex and removed within 48 hours. A cervical cap must be prescribed and fitted by a health care provider. It should be replaced every 2 years. Sponge A sponge is a soft, circular piece of polyurethane foam with spermicide in it. The sponge helps block sperm from entering the uterus, and the spermicide kills sperm. To use it, you make it wet and then insert it into the vagina. It should be inserted before sex, left in for at least 6 hours after sex, and removed and thrown away within 30 hours. Spermicides Spermicides are chemicals that kill or block sperm from entering the cervix and uterus. They can come as a cream, jelly, suppository, foam, or tablet. A spermicide should be inserted into the vagina with an  applicator at least 10-15 minutes before sex to allow time for it to work. The process must be repeated every time you have sex. Spermicides do not require a prescription. Intrauterine contraception Intrauterine device (IUD) An IUD is a T-shaped device that is put in a woman's uterus. There are two types: Hormone IUD.This type contains progestin, a synthetic form of the hormone progesterone. This type can stay in place for 3-5 years. Copper IUD.This type is wrapped in copper wire. It can stay in place for 10 years. Permanent methods of contraception Female tubal ligation In this method, a woman's fallopian tubes are sealed, tied, or blocked during surgery to prevent eggs from traveling to the uterus. Hysteroscopic sterilization In this method, a small, flexible insert is placed into each fallopian tube. The inserts cause scar tissue to form in the fallopian tubes and block them, so sperm cannot reach an egg. The procedure takes about 3 months to be effective. Another form of birth control must  be used during those 3 months. Female sterilization This is a procedure to tie off the tubes that carry sperm (vasectomy). After the procedure, the man can still ejaculate fluid (semen). Another form of birth control must be used for 3 months after the procedure. Natural planning methods Natural family planning In this method, a couple does not have sex on days when the woman could become pregnant. Calendar method In this method, the woman keeps track of the length of each menstrual cycle, identifies the days when pregnancy can happen, and does not have sex on those days. Ovulation method In this method, a couple avoids sex during ovulation. Symptothermal method This method involves not having sex during ovulation. The woman typically checks for ovulation by watching changes in her temperature and in the consistency of cervical mucus. Post-ovulation method In this method, a couple waits to have sex until  after ovulation. Where to find more information Centers for Disease Control and Prevention: [employer] Summary Contraception, also called birth control, refers to methods or devices that prevent pregnancy. Hormonal methods of contraception include implants, injections, pills, patches, vaginal rings, and emergency contraceptives. Barrier methods of contraception can include female condoms, female condoms, diaphragms, cervical caps, sponges, and spermicides. There are two types of IUDs (intrauterine devices). An IUD can be put in a woman's uterus to prevent pregnancy for 3-5 years. Permanent sterilization can be done through a procedure for males and females. Natural family planning methods involve nothaving sex on days when the woman could become pregnant. This information is not intended to replace advice given to you by your health care provider. Make sure you discuss any questions you have with your health care provider. Document Revised: 01/09/2020 Document Reviewed: 01/09/2020 Elsevier Patient Education  2022 [employer].

## 2021-06-21 NOTE — Progress Notes (Signed)
I connected with Julia Phillips 06/21/21 at 10:55 AM EDT by: MyChart video and verified that I am speaking with the correct person using two identifiers.  Patient is located at home and provider is located at [facility].     The purpose of this virtual visit is to provide medical care while limiting exposure to the novel coronavirus. I discussed the limitations, risks, security and privacy concerns of performing an evaluation and management service by MyChart video and the availability of in person appointments. I also discussed with the patient that there may be a patient responsible charge related to this service. By engaging in this virtual visit, you consent to the provision of healthcare.  Additionally, you authorize for your insurance to be billed for the services provided during this visit.  The patient expressed understanding and agreed to proceed.  The following staff members participated in the virtual visit:  Venora Maples, MD/MPH Attending Family Medicine Physician, Faculty [facility], [facility]     PRENATAL VISIT NOTE  Subjective:  Julia Phillips is a 20 y.o. G1P0000 at [redacted]w[redacted]d  for phone visit for ongoing prenatal care.  She is currently monitored for the following issues for this low-risk pregnancy and has Supervision of low-risk first pregnancy, first trimester; Nausea and vomiting during pregnancy; and Scoliosis on their problem list.  Patient reports heartburn and difficulty sleeping .  Contractions: Not present. Vag. Bleeding: None.  Movement: Present. Denies leaking of fluid.   The following portions of the patient's history were reviewed and updated as appropriate: allergies, current medications, past family history, past medical history, past social history, past surgical history and problem list.   Objective:  There were no vitals filed for this visit. Self-Obtained  Fetal Status:     Movement: Present      Assessment and Plan:  Pregnancy: G1P0000 at [redacted]w[redacted]d 1. Supervision of low-risk first pregnancy, first trimester Good fetal movement Trial maternity pillow for sleep Discussed needs to come fasting next visit for labs  2. Gastroesophageal reflux disease without esophagitis Significant heart burn, trial PPI - pantoprazole (PROTONIX) 20 MG tablet; Take 1 tablet (20 mg total) by mouth daily.  Dispense: 30 tablet; Refill: 5  Preterm labor symptoms and general obstetric precautions including but not limited to vaginal bleeding, contractions, leaking of fluid and fetal movement were reviewed in detail with the patient.  Return in 4 weeks (on 07/19/2021) for Dyad patient, ob visit, 28 wk labs.  Future Appointments  Date Time Provider Department Center  06/24/2021  1:30 PM [facility] NURSE [facility]  06/24/2021  1:45 PM [facility] US4 [facility]  07/26/2021  8:50 AM [facility] LAB [facility]  07/26/2021 10:55 AM [facility]     Time spent on virtual visit: 12 minutes  Venora Maples, MD

## 2021-06-24 ENCOUNTER — Ambulatory Visit (HOSPITAL_BASED_OUTPATIENT_CLINIC_OR_DEPARTMENT_OTHER): Payer: Medicaid Other | Admitting: Obstetrics

## 2021-06-24 ENCOUNTER — Ambulatory Visit: Payer: Medicaid Other | Attending: Obstetrics and Gynecology | Admitting: *Deleted

## 2021-06-24 ENCOUNTER — Other Ambulatory Visit: Payer: Self-pay

## 2021-06-24 ENCOUNTER — Ambulatory Visit (HOSPITAL_BASED_OUTPATIENT_CLINIC_OR_DEPARTMENT_OTHER): Payer: Medicaid Other

## 2021-06-24 ENCOUNTER — Other Ambulatory Visit: Payer: Self-pay | Admitting: *Deleted

## 2021-06-24 ENCOUNTER — Other Ambulatory Visit: Payer: Self-pay | Admitting: Obstetrics and Gynecology

## 2021-06-24 VITALS — BP 139/93 | HR 67

## 2021-06-24 DIAGNOSIS — Z3A24 24 weeks gestation of pregnancy: Secondary | ICD-10-CM

## 2021-06-24 DIAGNOSIS — O365921 Maternal care for other known or suspected poor fetal growth, second trimester, fetus 1: Secondary | ICD-10-CM

## 2021-06-24 DIAGNOSIS — O99212 Obesity complicating pregnancy, second trimester: Secondary | ICD-10-CM

## 2021-06-24 DIAGNOSIS — Z3401 Encounter for supervision of normal first pregnancy, first trimester: Secondary | ICD-10-CM

## 2021-06-24 DIAGNOSIS — Z362 Encounter for other antenatal screening follow-up: Secondary | ICD-10-CM

## 2021-06-24 DIAGNOSIS — Z363 Encounter for antenatal screening for malformations: Secondary | ICD-10-CM | POA: Insufficient documentation

## 2021-06-24 DIAGNOSIS — O99322 Drug use complicating pregnancy, second trimester: Secondary | ICD-10-CM | POA: Diagnosis not present

## 2021-06-24 DIAGNOSIS — E669 Obesity, unspecified: Secondary | ICD-10-CM

## 2021-06-24 IMAGING — US US MFM OB FOLLOW-UP
1 series · 12 of 28 positions shown · non-contrast
Comparison: none

[Series 1: us mfm ob follow-up · 12 of 78 slices shown]
[im 3/78]
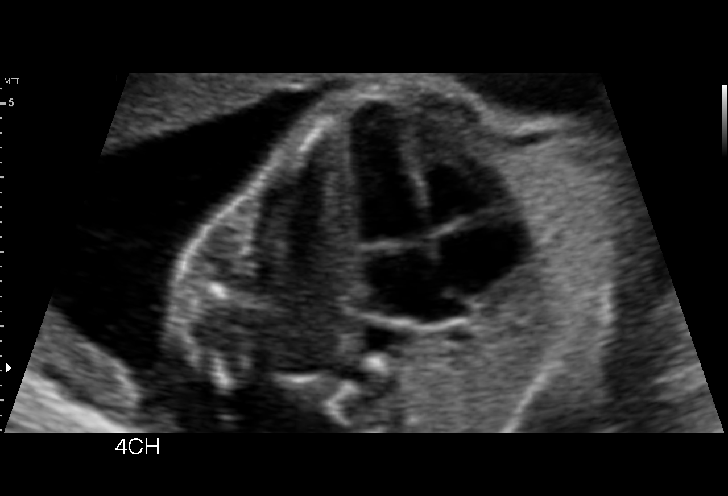
[im 9/78]
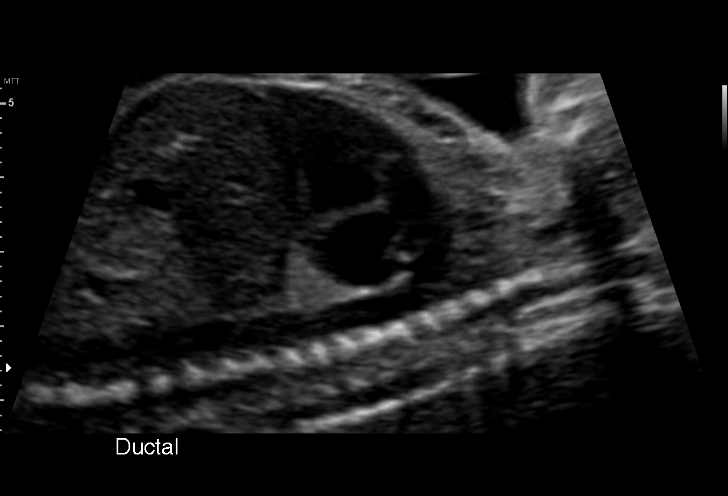
[im 15/78]
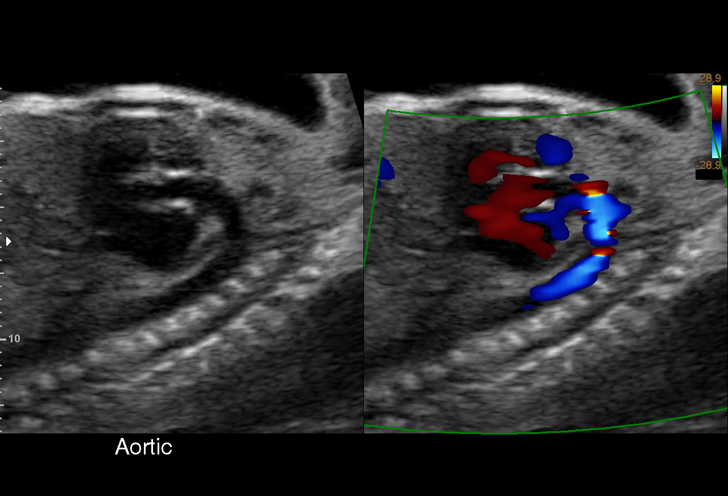
[im 23/78]
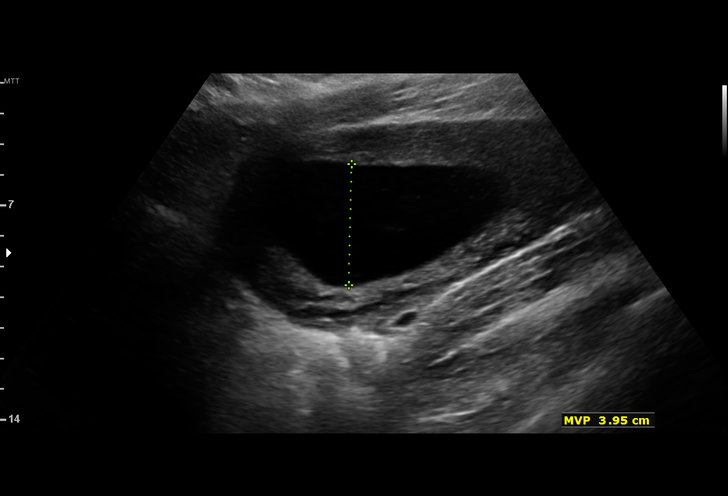
[im 29/78]
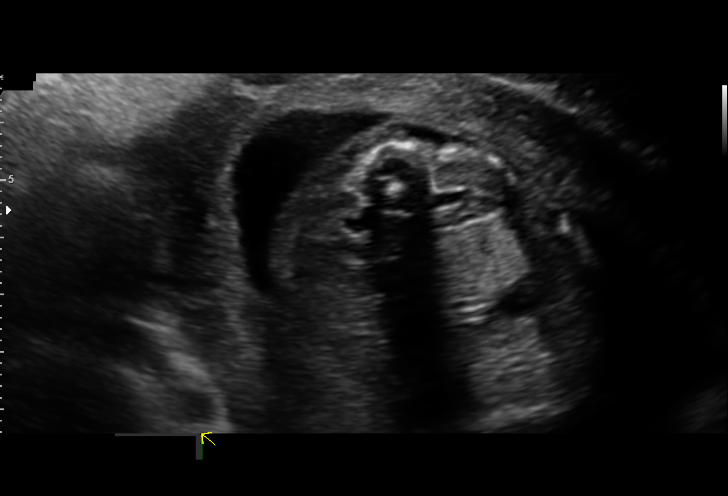
[im 35/78]
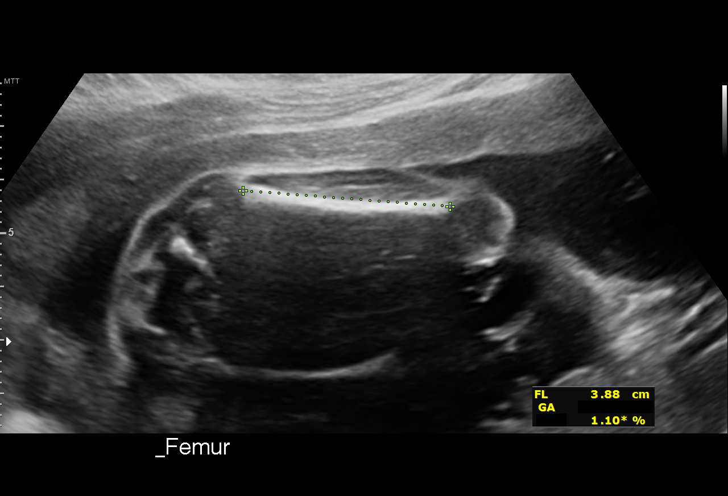
[im 43/78]
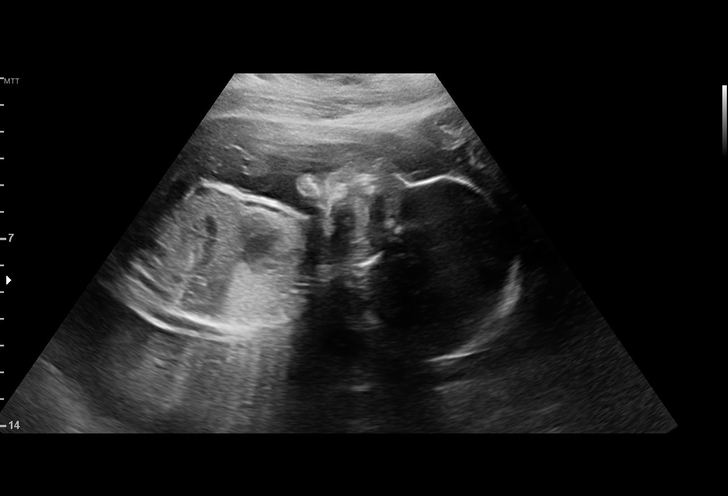
[im 49/78]
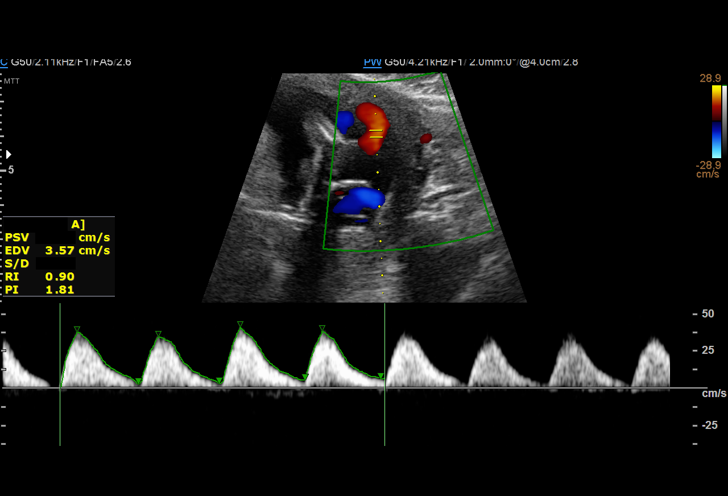
[im 55/78]
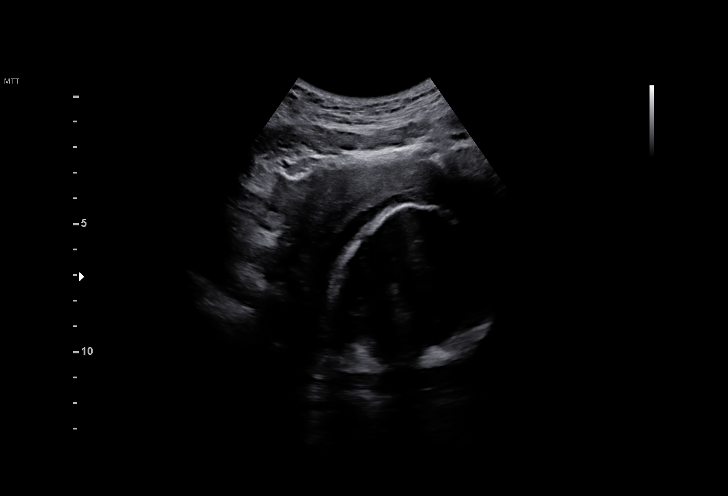
[im 63/78]
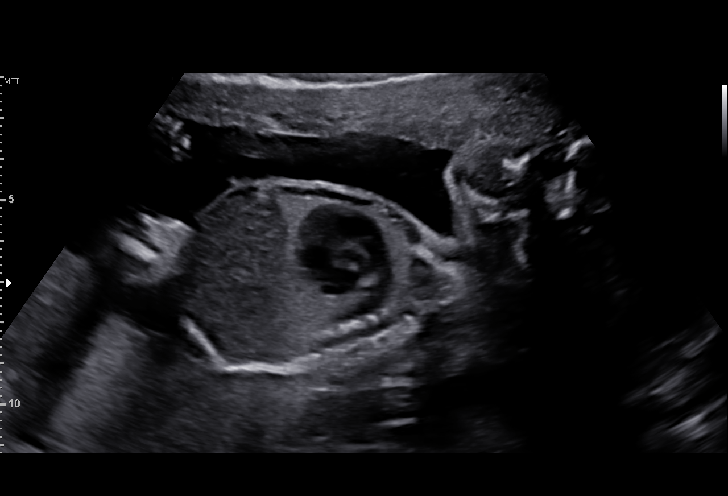
[im 69/78]
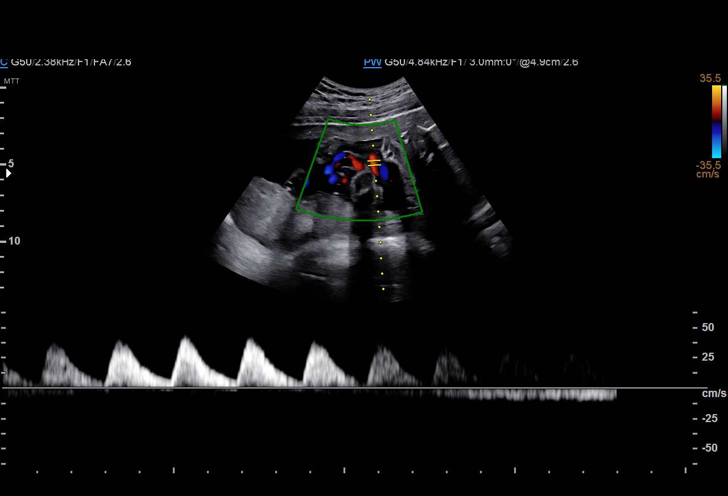
[im 75/78]
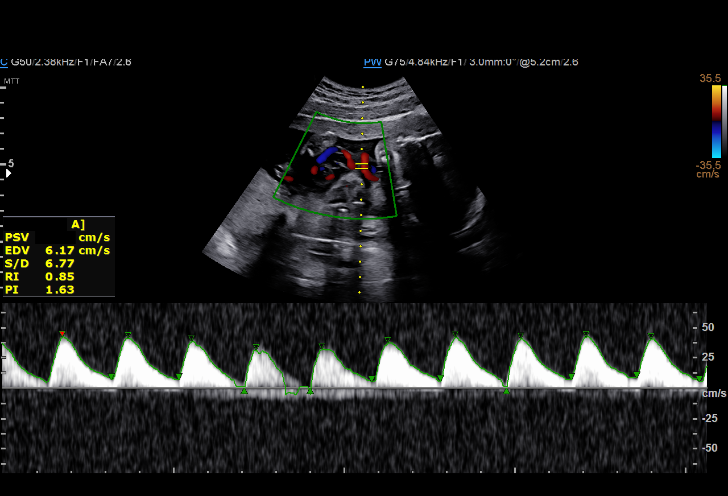

[12 of 28 positions shown; findings below may reference images not displayed]

Indications

 Obesity complicating pregnancy, second         [T1]
 trimester (BMI 35.8)
 Drug use complicating pregnancy, second        [T1]
 trimester (Marijuana)
 24 weeks gestation of pregnancy
 Encounter for antenatal screening for          [T1]
 malformations
 LR Panorama
Fetal Evaluation

 Num Of Fetuses:         1
 Fetal Heart Rate(bpm):  164
 Cardiac Activity:       Observed
 Presentation:           Cephalic
 Placenta:               Posterior Fundal
 P. Cord Insertion:      Visualized, central

 Amniotic Fluid
 AFI FV:      Within normal limits

                             Largest Pocket(cm)

Biometry
 BPD:        56  mm     G. Age:  23w 1d        3.8  %    CI:        74.65   %    70 - 86
                                                         FL/HC:      18.9   %    18.7 -
 HC:      205.7  mm     G. Age:  22w 5d        < 1  %    HC/AC:      1.14        1.04 -
 AC:      180.4  mm     G. Age:  22w 6d        3.9  %    FL/BPD:     69.3   %    71 - 87
 FL:       38.8  mm     G. Age:  22w 3d        1.1  %    FL/AC:      21.5   %    20 - 24
 CER:        27  mm     G. Age:  24w 1d         43  %
 LV:        6.4  mm
 CM:        6.2  mm

 Est. FW:     528  gm      1 lb 3 oz    < 1  %
Gestational Age

 LMP:           24w 5d        Date:  [DATE]                 EDD:   [DATE]
 U/S Today:     22w 6d                                        EDD:   [DATE]
 Best:          24w 5d     Det. By:  LMP  ([DATE])          EDD:   [DATE]
Anatomy

 Cranium:               Appears normal         LVOT:                   Appears normal
 Cavum:                 Appears normal         Aortic Arch:            Appears normal
 Ventricles:            Appears normal         Ductal Arch:            Appears normal
 Choroid Plexus:        Appears normal         Diaphragm:              Appears normal
 Cerebellum:            Appears normal         Stomach:                Appears normal, left
                                                                       sided
 Posterior Fossa:       Appears normal         Abdomen:                Appears normal
 Nuchal Fold:           Appears normal         Abdominal Wall:         Appears nml (cord
                                                                       insert, abd wall)
 Face:                  Appears normal         Cord Vessels:           Appears normal (3
                        (orbits and profile)                           vessel cord)
 Lips:                  Appears normal         Kidneys:                Appear normal
 Palate:                Appears normal         Bladder:                Appears normal
 Thoracic:              Appears normal         Spine:                  Appears normal
 Heart:                 Appears normal         Upper Extremities:      Appears normal
                        (4CH, axis, and
                        situs)
 RVOT:                  Appears normal         Lower Extremities:      Appears normal

 Other:  Female gender previously seen. Technically difficult due to maternal
         habitus and early GA. Nasal bone, 5th digits, feet, and lenses
         previously visualized. VC, 3VV and 3VTV visualized.
Doppler - Fetal Vessels

 Umbilical Artery
  S/D     %tile      RI    %tile      PI    %tile            ADFV    RDFV
  6.39   > 97.5    0.84       97    1.56   > 97.5              Yes      No

Cervix Uterus Adnexa

 Cervix
 Length:           3.58  cm.
 Normal appearance by transabdominal scan.
 Uterus
 No abnormality visualized.

 Right Ovary
 Within normal limits.

 Left Ovary
 Not visualized.

 Adnexa
 No adnexal mass visualized.
Comments

 CEEJAY was seen for a follow up exam as the views
 of the fetal anatomy were unable to be fully visualized during
 her last exam.  Her pregnancy has also been complicated by
 maternal obesity with a BMI of 35.8.  She denies any
 problems since her last exam.
 She was informed that the overall EFW obtained today (1
 pound 3 ounces, 528 g) measured at less than the 1st
 percentile for her gestational age, indicating IUGR.  There
 was normal amniotic fluid noted today.
 Doppler studies of the umbilical arteries performed today
 showed an elevated S/D ratio with intermittent absent end-
 diastolic flow.  There were no signs of reversed end-diastolic
 flow noted today.
 The views of the fetal anatomy were visualized today.  There
 were no obvious anomalies noted.
 The limitations of ultrasound in the detection of all anomalies
 was discussed.
 The following were discussed today:

 Intrauterine fetal growth restriction (IUGR)

 The implications and management of IUGR was discussed
 with the patient and her partner.  They were advised that the
 the main risk of IUGR is the demise of the growth restricted
 fetus.  There is also an increased risk of an indicated preterm
 birth.

 Echogenic areas possibly indicating areas of infarction were
 noted throughout the posterior fundal placenta.  The patient
 and her partner were advised that placental dysfunction is the
 most likely cause of IUGR.

 The management of IUGR is intense fetal surveillance
 (weekly to twice or even three times a week) using umbilical
 artery Doppler studies and NSTs/BPP's (when she reaches
 an appropriate gestational age) with the goal of delivery at an
 appropriate gestational age to avoid a fetal demise.

 The couple were advised that the goal for her delivery would
 be at 32-34 weeks or greater. An earlier delivery may be
 necessary if reversed end diastolic flow is noted on her
 umbilical artery studies.

 As she is still early in her pregnancy, we will continue to see
 her for weekly ultrasound exams for fetal assessment.  We
 will increase the frequency of her visits depending on the
 results of her umbilical artery Doppler studies.

 They understand that should there be worsening of her
 umbilical artery Doppler studies, that hospitalization for twice
 daily NSTs may be necessary to help her reach a more
 optimal gestational age for delivery.

 A follow-up exam was scheduled in 1 week.

 We will continue to make further recommendations based on
 the results of her future ultrasound exams.

 The patient and her partner stated that all of their questions
 have been answered.

 A total of 25 minutes was spent counseling and coordinating
 the care for this patient.  Greater than 50% of the time was
 spent in direct face-to-face contact.

## 2021-06-24 NOTE — Progress Notes (Signed)
MFM Note  Julia Phillips was seen for a follow up exam as the views of the fetal anatomy were unable to be fully visualized during her last exam.  Her pregnancy has also been complicated by maternal obesity with a BMI of 35.8.  She denies any problems since her last exam.  She was informed that the overall EFW obtained today (1 pound 3 ounces, 528 g) measured at less than the 1st percentile for her gestational age, indicating IUGR.  There was normal amniotic fluid noted today.  Doppler studies of the umbilical arteries performed today showed an elevated S/D ratio with intermittent absent end-diastolic flow.  There were no signs of reversed end-diastolic flow noted today.  The views of the fetal anatomy were visualized today.  There were no obvious anomalies noted.    The limitations of ultrasound in the detection of all anomalies was discussed.    The following were discussed today:  Intrauterine fetal growth restriction (IUGR)  The implications and management of IUGR was discussed with the patient and her partner.  They were advised that the the main risk of IUGR is the demise of the growth restricted fetus.  There is also an increased risk of an indicated preterm birth.  Echogenic areas possibly indicating areas of infarction were noted throughout the posterior fundal placenta.  The patient and her partner were advised that placental dysfunction is the most likely cause of IUGR.  The management of IUGR is intense fetal surveillance (weekly to twice or even three times a week) using umbilical artery Doppler studies and NSTs/BPP's (when she reaches an appropriate gestational age) with the goal of delivery at an appropriate gestational age to avoid a fetal demise.  The couple were advised that the goal for her delivery would be at 32-34 weeks or greater. An earlier delivery may be necessary if reversed end diastolic flow is noted on her umbilical artery studies.  As she is still early in her  pregnancy, we will continue to see her for weekly ultrasound exams for fetal assessment.  We will increase the frequency of her visits depending on the results of her umbilical artery Doppler studies.  They understand that should there be worsening of her umbilical artery Doppler studies, that hospitalization for twice daily NSTs may be necessary to help her reach a more optimal gestational age for delivery.  A follow-up exam was scheduled in 1 week.  We will continue to make further recommendations based on the results of her future ultrasound exams.    The patient and her partner stated that all of their questions have been answered.  A total of 25 minutes was spent counseling and coordinating the care for this patient.  Greater than 50% of the time was spent in direct face-to-face contact.

## 2021-07-01 ENCOUNTER — Encounter (HOSPITAL_COMMUNITY): Payer: Self-pay | Admitting: Obstetrics & Gynecology

## 2021-07-01 ENCOUNTER — Ambulatory Visit: Payer: Medicaid Other | Attending: Obstetrics | Admitting: Obstetrics

## 2021-07-01 ENCOUNTER — Ambulatory Visit: Payer: Medicaid Other | Admitting: *Deleted

## 2021-07-01 ENCOUNTER — Ambulatory Visit: Payer: Medicaid Other | Attending: Obstetrics

## 2021-07-01 ENCOUNTER — Other Ambulatory Visit: Payer: Self-pay

## 2021-07-01 ENCOUNTER — Inpatient Hospital Stay (HOSPITAL_COMMUNITY)
Admission: AD | Admit: 2021-07-01 | Discharge: 2021-07-01 | Disposition: A | Discharge status: Other Acute Inpatient Hospital | Payer: Medicaid Other | Attending: Obstetrics & Gynecology | Admitting: Obstetrics & Gynecology

## 2021-07-01 VITALS — BP 159/101 | HR 72

## 2021-07-01 VITALS — BP 163/101 | HR 73

## 2021-07-01 DIAGNOSIS — O1412 Severe pre-eclampsia, second trimester: Secondary | ICD-10-CM | POA: Diagnosis present

## 2021-07-01 DIAGNOSIS — O99322 Drug use complicating pregnancy, second trimester: Secondary | ICD-10-CM | POA: Diagnosis not present

## 2021-07-01 DIAGNOSIS — Z3A25 25 weeks gestation of pregnancy: Secondary | ICD-10-CM | POA: Insufficient documentation

## 2021-07-01 DIAGNOSIS — O99212 Obesity complicating pregnancy, second trimester: Secondary | ICD-10-CM | POA: Diagnosis not present

## 2021-07-01 DIAGNOSIS — O132 Gestational [pregnancy-induced] hypertension without significant proteinuria, second trimester: Secondary | ICD-10-CM | POA: Insufficient documentation

## 2021-07-01 DIAGNOSIS — Z20822 Contact with and (suspected) exposure to covid-19: Secondary | ICD-10-CM | POA: Diagnosis not present

## 2021-07-01 DIAGNOSIS — O365920 Maternal care for other known or suspected poor fetal growth, second trimester, not applicable or unspecified: Secondary | ICD-10-CM | POA: Diagnosis not present

## 2021-07-01 DIAGNOSIS — Z3401 Encounter for supervision of normal first pregnancy, first trimester: Secondary | ICD-10-CM | POA: Diagnosis present

## 2021-07-01 DIAGNOSIS — O365921 Maternal care for other known or suspected poor fetal growth, second trimester, fetus 1: Secondary | ICD-10-CM | POA: Diagnosis present

## 2021-07-01 DIAGNOSIS — F1290 Cannabis use, unspecified, uncomplicated: Secondary | ICD-10-CM | POA: Diagnosis not present

## 2021-07-01 DIAGNOSIS — E669 Obesity, unspecified: Secondary | ICD-10-CM | POA: Diagnosis not present

## 2021-07-01 LAB — URINALYSIS, ROUTINE W REFLEX MICROSCOPIC
Bacteria, UA: NONE SEEN
Bilirubin Urine: NEGATIVE
Glucose, UA: NEGATIVE mg/dL
Hgb urine dipstick: NEGATIVE
Ketones, ur: NEGATIVE mg/dL
Leukocytes,Ua: NEGATIVE
Nitrite: NEGATIVE
Protein, ur: 100 mg/dL — AB
Specific Gravity, Urine: 1.025 (ref 1.005–1.030)
pH: 6 (ref 5.0–8.0)

## 2021-07-01 LAB — PROTEIN / CREATININE RATIO, URINE
Creatinine, Urine: 197.14 mg/dL
Protein Creatinine Ratio: 1.22 mg/mg{Cre} — ABNORMAL HIGH (ref 0.00–0.15)
Total Protein, Urine: 240 mg/dL

## 2021-07-01 LAB — COMPREHENSIVE METABOLIC PANEL
ALT: 10 U/L (ref 0–44)
AST: 20 U/L (ref 15–41)
Albumin: 3.3 g/dL — ABNORMAL LOW (ref 3.5–5.0)
Alkaline Phosphatase: 62 U/L (ref 38–126)
Anion gap: 8 (ref 5–15)
BUN: 7 mg/dL (ref 6–20)
CO2: 23 mmol/L (ref 22–32)
Calcium: 9.1 mg/dL (ref 8.9–10.3)
Chloride: 105 mmol/L (ref 98–111)
Creatinine, Ser: 0.64 mg/dL (ref 0.44–1.00)
GFR, Estimated: >60 mL/min (ref >60)
Glucose, Bld: 85 mg/dL (ref 70–99)
Potassium: 4 mmol/L (ref 3.5–5.1)
Sodium: 136 mmol/L (ref 135–145)
Total Bilirubin: 0.5 mg/dL (ref 0.3–1.2)
Total Protein: 6.4 g/dL — ABNORMAL LOW (ref 6.5–8.1)

## 2021-07-01 LAB — RESP PANEL BY RT-PCR (FLU A&B, COVID) ARPGX2
Influenza A by PCR: NEGATIVE
Influenza B by PCR: NEGATIVE
SARS Coronavirus 2 by RT PCR: NEGATIVE

## 2021-07-01 LAB — CBC
HCT: 35.6 % — ABNORMAL LOW (ref 36.0–46.0)
Hemoglobin: 11.4 g/dL — ABNORMAL LOW (ref 12.0–15.0)
MCH: 27.7 pg (ref 26.0–34.0)
MCHC: 32 g/dL (ref 30.0–36.0)
MCV: 86.6 fL (ref 80.0–100.0)
Platelets: 242 10*3/uL (ref 150–400)
RBC: 4.11 MIL/uL (ref 3.87–5.11)
RDW: 13.9 % (ref 11.5–15.5)
WBC: 6.4 10*3/uL (ref 4.0–10.5)
nRBC: 0 % (ref 0.0–0.2)

## 2021-07-01 LAB — TYPE AND SCREEN
ABO/RH(D): O POS
Antibody Screen: NEGATIVE

## 2021-07-01 IMAGING — US US MFM UA CORD DOPPLER
1 series · 13 of 28 positions shown · non-contrast
Comparison: none

[Series 1: us mfm ua cord doppler · 13 of 42 slices shown]
[im 2/42]
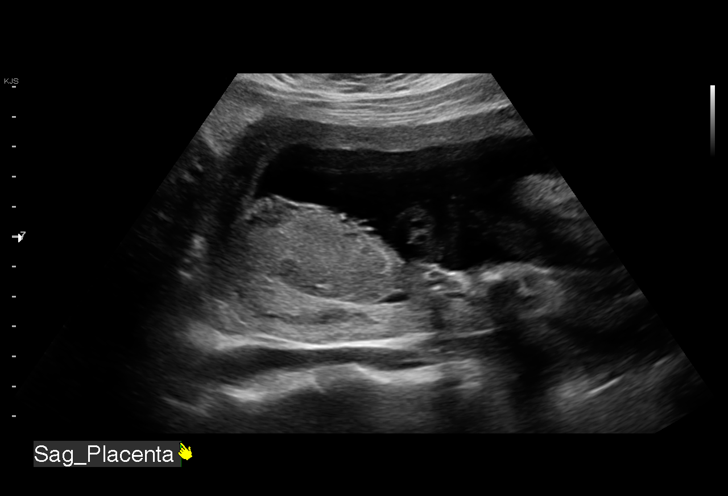
[im 5/42]
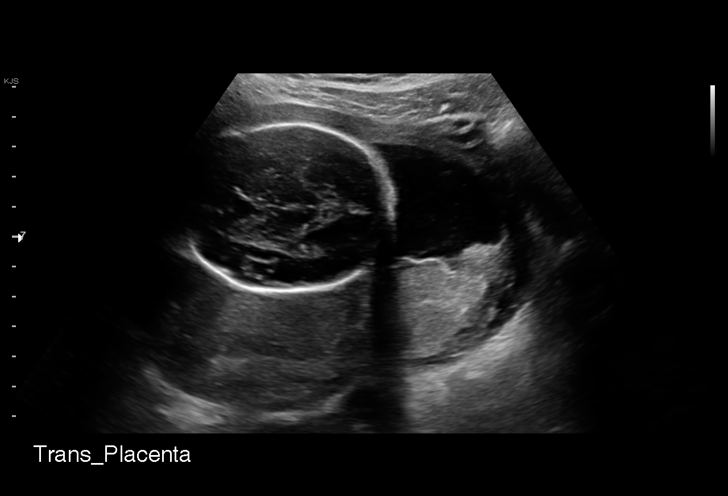
[im 8/42]
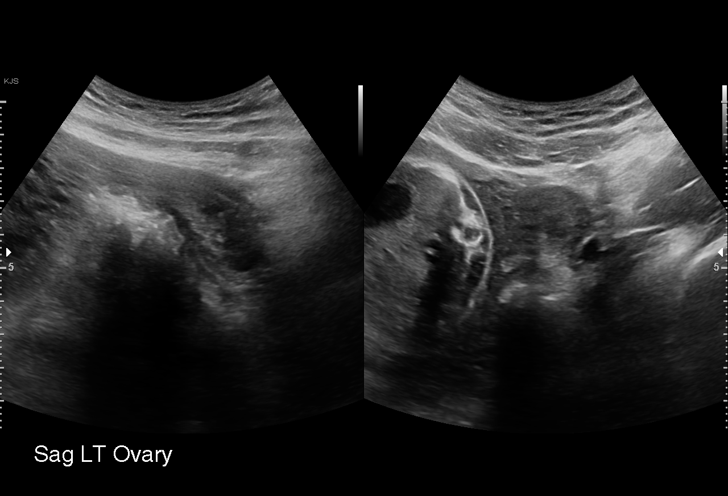
[im 11/42]
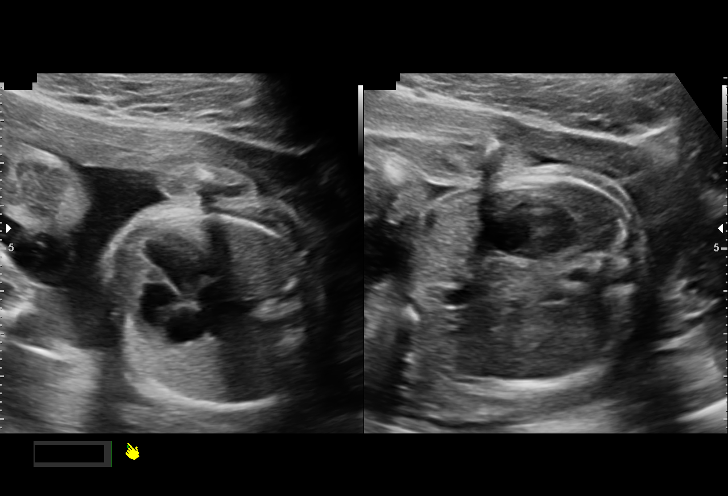
[im 14/42]
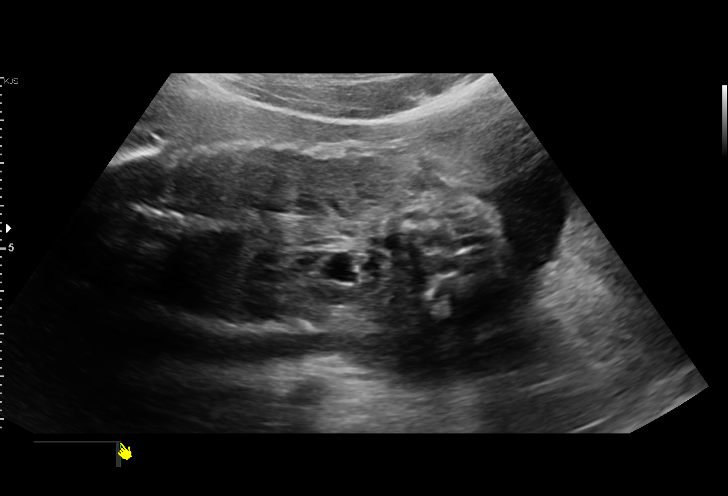
[im 17/42]
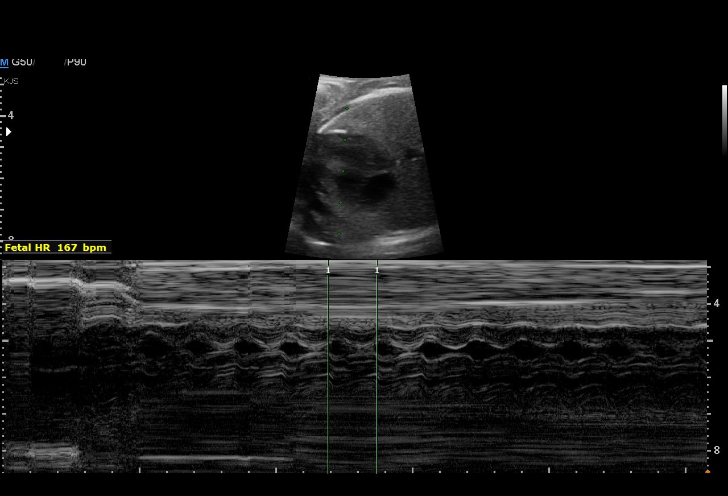
[im 22/42]
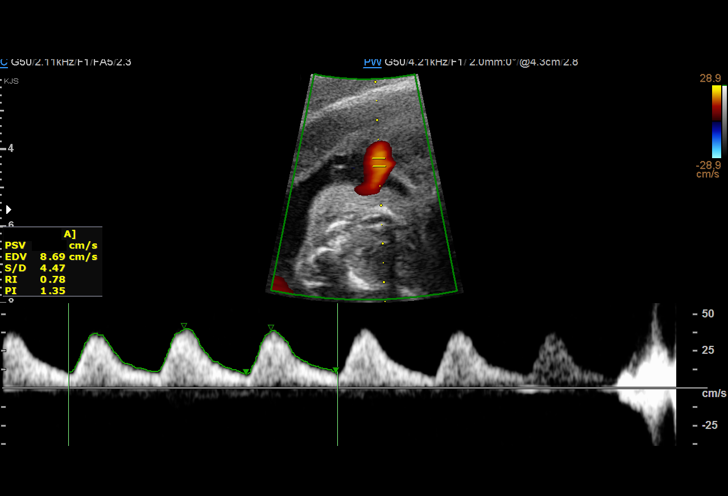
[im 25/42]
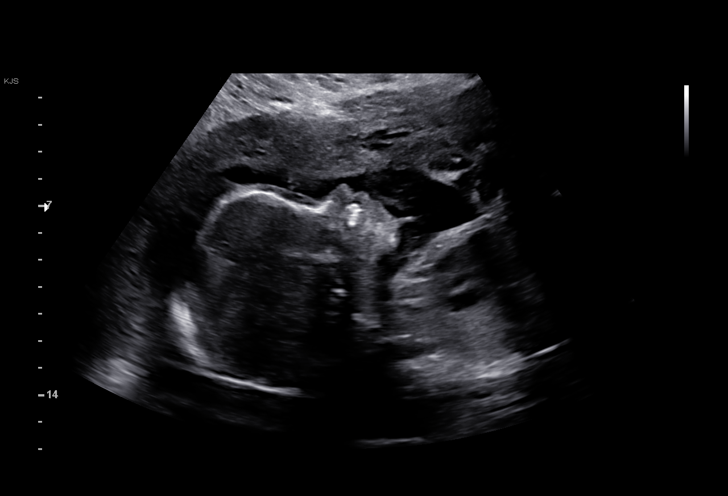
[im 28/42]
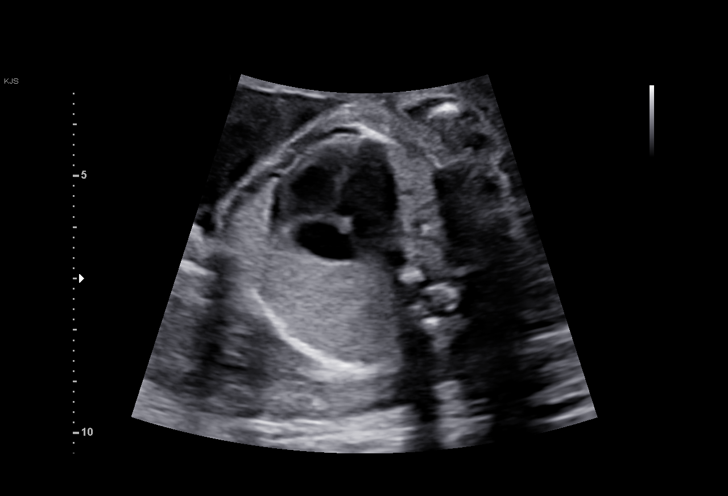
[im 31/42]
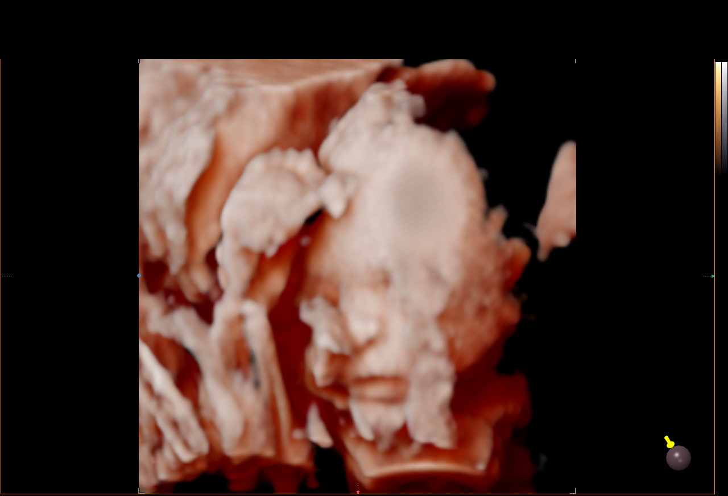
[im 34/42]
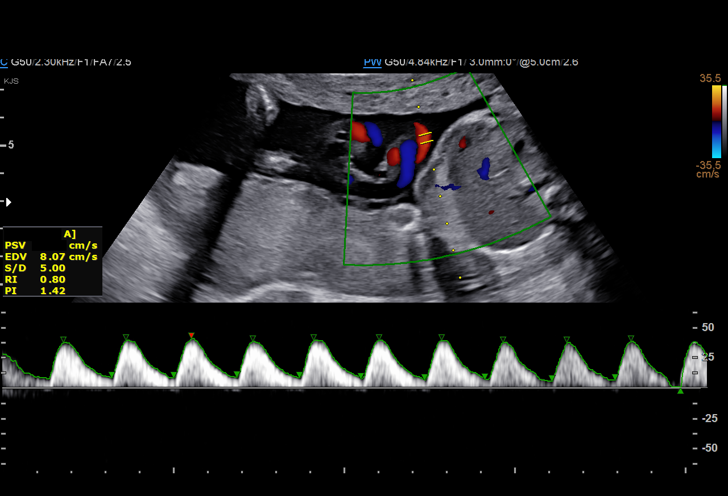
[im 37/42]
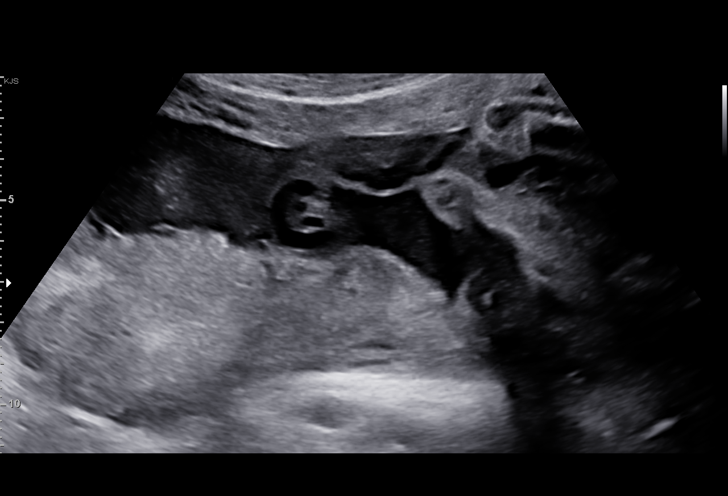
[im 40/42]
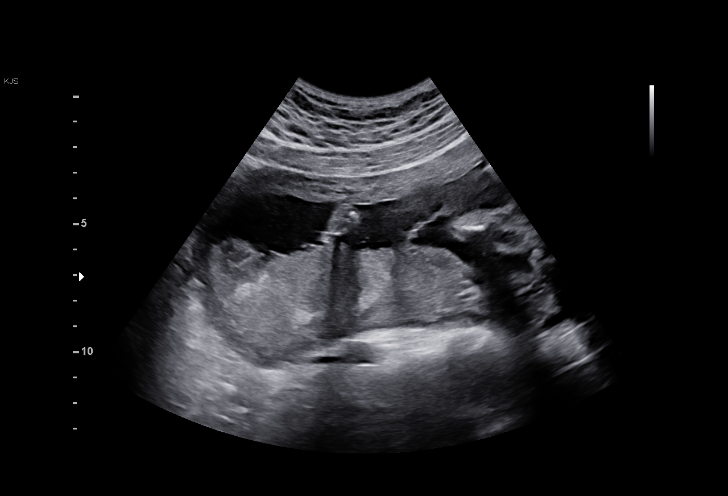

[13 of 28 positions shown; findings below may reference images not displayed]

Indications

 Obesity complicating pregnancy, second         [O3]
 trimester (BMI 35.8)
 Drug use complicating pregnancy, second        [O3]
 trimester (Marijuana)
 25 weeks gestation of pregnancy
 LR NIPS
Fetal Evaluation

 Num Of Fetuses:         1
 Fetal Heart Rate(bpm):  167
 Cardiac Activity:       Observed
 Presentation:           Cephalic
 Placenta:               Posterior Fundal
 P. Cord Insertion:      Previously Visualized

 Amniotic Fluid
 AFI FV:      Within normal limits

                             Largest Pocket(cm)

Biometry
 LV:          3  mm
Gestational Age

 LMP:           25w 5d        Date:  [DATE]                 EDD:   [DATE]
 Best:          25w 5d     Det. By:  LMP  ([DATE])          EDD:   [DATE]
Anatomy

 Cranium:               Appears normal         LVOT:                   Previously seen
 Cavum:                 Appears normal         Aortic Arch:            Previously seen
 Ventricles:            Appears normal         Ductal Arch:            Previously seen
 Choroid Plexus:        Previously seen        Diaphragm:              Appears normal
 Cerebellum:            Previously seen        Stomach:                Appears normal, left
                                                                       sided
 Posterior Fossa:       Previously seen        Abdomen:                Appears normal
 Nuchal Fold:           Not applicable (>20    Abdominal Wall:         Previously seen
                        wks GA)
 Face:                  Orbits and profile     Cord Vessels:           Previously seen
                        previously seen
 Lips:                  Previously seen        Kidneys:                Appear normal
 Palate:                Previously seen        Bladder:                Appears normal
 Thoracic:              Appears normal         Spine:                  Previously seen
 Heart:                 Previously seen        Upper Extremities:      Previously seen
 RVOT:                  Previously seen        Lower Extremities:      Previously seen

 Other:  Female gender previously seen. Technically difficult due to maternal
         habitus and early GA. Nasal bone, 5th digits, feet, lenses, VC, 3VV
         and 3VTV previously visualized.
Doppler - Fetal Vessels

 Umbilical Artery
  S/D     %tile      RI    %tile      PI    %tile     PSV    ADFV    RDFV
                                                    (cm/s)
  4.47       92    0.78       89    1.[REDACTED]      No      No

Cervix Uterus Adnexa

 Cervix
 Length:           3.95  cm.
 Normal appearance by transabdominal scan.

 Uterus
 No abnormality visualized.

 Right Ovary
 Within normal limits.

 Left Ovary
 Within normal limits.

 Adnexa
 No adnexal mass visualized.
Comments

 [facility] was seen due to an IUGR fetus.  Intermittent
 absent end-diastolic flow was noted on her umbilical artery
 Doppler studies last week.  The overall EFW obtained last
 week was 1 pound 3 ounces, 528 g (less than the 1st
 percentile).  She denies any problems since her last exam.
 She reports feeling fetal movements throughout the day.
 Her blood pressures in our office today were 164/101,
 159/101, and 160/101.  She denies any signs or symptoms of
 preeclampsia.
 There was normal amniotic fluid noted on today's ultrasound
 exam.
 Doppler studies of the umbilical arteries performed due to
 fetal growth restriction showed an elevated S/D ratio of 4.47.
 There were no signs of absent or reversed end-diastolic flow
 noted today.
 Multiple echogenic areas were noted in the small appearing
 posterior placenta.  The echogenic areas may represent
 areas of infarction which may have caused IUGR with
 abnormal umbilical artery Doppler studies.

 As women with IUGR and abnormal placental function are at
 increased risk for developing preeclampsia, given her
 extremely elevated blood pressures noted in our office today,
 she was sent to the [facility] following today's ultrasound exam
 for preeclampsia evaluation.

 Should her blood pressures remain extremely elevated and
 she is found to have proteinuria or abnormal PIH labs, she
 should be admitted for observation and be given a course of
 antenatal corticosteroids.

 Given her early gestational age, she may be started on
 antihypertensive medications (Procardia) so that she can
 reach a more optimal gestational age for delivery.

 The significance of today's ultrasound findings and her blood
 pressures were discussed with the patient.  She stated that
 all of her questions have been answered.

 Should she be discharged home, she already has another
 umbilical artery Doppler study scheduled in our office next
 week.

 A total of 20 minutes was spent counseling and coordinating
 the care for this patient.  Greater than 50% of the time was
 spent in direct face-to-face contact.

## 2021-07-01 MED ORDER — HYDRALAZINE HCL 20 MG/ML IJ SOLN
10.0000 mg | INTRAMUSCULAR | Status: DC | PRN
  Filled 2021-07-01: qty 1

## 2021-07-01 MED ORDER — LABETALOL HCL 5 MG/ML IV SOLN
20.0000 mg | INTRAVENOUS | Status: DC | PRN
  Administered 2021-07-01: 20 mg via INTRAVENOUS
  Filled 2021-07-01: qty 4

## 2021-07-01 MED ORDER — HYDRALAZINE HCL 20 MG/ML IJ SOLN
10.0000 mg | INTRAMUSCULAR | Status: DC | PRN
  Administered 2021-07-01: 10 mg via INTRAVENOUS

## 2021-07-01 MED ORDER — LABETALOL HCL 5 MG/ML IV SOLN
40.0000 mg | INTRAVENOUS | Status: DC | PRN
  Administered 2021-07-01: 40 mg via INTRAVENOUS
  Filled 2021-07-01: qty 8

## 2021-07-01 MED ORDER — MAGNESIUM SULFATE 40 GM/1000ML IV SOLN
2.0000 g/h | INTRAVENOUS | Status: DC
  Administered 2021-07-01: 2 g/h via INTRAVENOUS
  Filled 2021-07-01: qty 1000

## 2021-07-01 MED ORDER — LABETALOL HCL 5 MG/ML IV SOLN: 20.0000 mg | INTRAVENOUS | Status: DC | PRN

## 2021-07-01 MED ORDER — MAGNESIUM SULFATE BOLUS VIA INFUSION
4.0000 g | Freq: Once | INTRAVENOUS | Status: AC
  Administered 2021-07-01: 4 g via INTRAVENOUS
  Filled 2021-07-01: qty 1000

## 2021-07-01 MED ORDER — LACTATED RINGERS IV SOLN: Freq: Once | INTRAVENOUS | Status: AC

## 2021-07-01 MED ORDER — LABETALOL HCL 5 MG/ML IV SOLN
80.0000 mg | INTRAVENOUS | Status: DC | PRN
  Administered 2021-07-01: 80 mg via INTRAVENOUS
  Filled 2021-07-01: qty 16

## 2021-07-01 MED ORDER — LABETALOL HCL 5 MG/ML IV SOLN: 40.0000 mg | INTRAVENOUS | Status: DC | PRN

## 2021-07-01 MED ORDER — LABETALOL HCL 5 MG/ML IV SOLN: 80.0000 mg | INTRAVENOUS | Status: DC | PRN

## 2021-07-01 MED ORDER — BETAMETHASONE SOD PHOS & ACET 6 (3-3) MG/ML IJ SUSP
12.0000 mg | Freq: Once | INTRAMUSCULAR | Status: AC
  Administered 2021-07-01: 12 mg via INTRAMUSCULAR
  Filled 2021-07-01: qty 5

## 2021-07-01 NOTE — MAU Note (Signed)
[facility] and was transferred to [facility] with transport team. Report given by this RN and stated they were on the way and would arrive in about 

## 2021-07-01 NOTE — MAU Note (Signed)
Sent here by her dr's.  Had really high blood pressure, wanted to make sure she wasn't going through anything bad. Denies HA, visual changes, increase in swelling or RUQ pain. Denies vag bleeding or d/c.  Reports +FM

## 2021-07-01 NOTE — Progress Notes (Signed)
MFM Note  Julia Phillips was seen due to an IUGR fetus.  Intermittent absent end-diastolic flow was noted on her umbilical artery Doppler studies last week.  The overall EFW obtained last week was 1 pound 3 ounces, 528 g (less than the 1st percentile).  She denies any problems since her last exam.  She reports feeling fetal movements throughout the day.  Her blood pressures in our office today were 164/101, 159/101, and 160/101.  She denies any signs or symptoms of preeclampsia.  There was normal amniotic fluid noted on today's ultrasound exam.  Doppler studies of the umbilical arteries performed due to fetal growth restriction showed an elevated S/D ratio of 4.47. There were no signs of absent or reversed end-diastolic flow noted today.  Multiple echogenic areas were noted in the small appearing posterior placenta.  The echogenic areas may represent areas of infarction which may have caused IUGR with abnormal umbilical artery Doppler studies.  As women with IUGR and abnormal placental function are at increased risk for developing preeclampsia, given her extremely elevated blood pressures noted in our office today, she was sent to the [facility] following today's ultrasound exam for preeclampsia evaluation.  Should her blood pressures remain extremely elevated and she is found to have proteinuria or abnormal PIH labs, she should be admitted for observation and be given a course of antenatal corticosteroids.    Given her early gestational age, she may be started on antihypertensive medications (Procardia) so that she can reach a more optimal gestational age for delivery.  The significance of today's ultrasound findings and her blood pressures were discussed with the patient.  She stated that all of her questions have been answered.    Should she be discharged home, she already has another umbilical artery Doppler study scheduled in our office next week.  A total of 20 minutes was spent counseling and  coordinating the care for this patient.  Greater than 50% of the time was spent in direct face-to-face contact.

## 2021-07-01 NOTE — MAU Note (Signed)
Report called to [facility] L&D nurse.

## 2021-07-01 NOTE — MAU Note (Signed)
Dr. Duncan at bedside 

## 2021-07-01 NOTE — H&P (Signed)
History     CSN: [id number]  Arrival date and time: 07/01/21 1639   Event Date/Time   First Provider Initiated Contact with Patient 07/01/21 1731      Chief Complaint  Patient presents with   Hypertension   Hypertension Pertinent negatives include no chest pain, headaches or palpitations.   Julia Phillips is  a 20 y.o. G1P0000 at 26w5dby L=18 [facility] who presents to MAU from [facility] for evaluation of new onset HTN in s/o IUGR. Patient had readings of 164/101, 159/101, and 160/101. She denies history of HTN. On arrival to MAU she denies headache, visual disturbances, RUQ/epigastric pain, new onset swelling or weight gain.  [facility]   She was seen at the end of September for her anatomy scan which was normal but incomplete. Growth at that time consistent with LMP. She then had her follow up anatomy scan on 11/7 which was FGR at <1%, 528g with IAEDF. She had mild blood pressures at that time 130-140/80s. She then returned in a week for her dopplers which were elevated but no evidence of AEDF. However at that time she was then noted to be in the 160s/100s persistently on rechecks. She was sent to MAU for further evaluation and trending of  her blood pressures.   On arrival here her blood pressures are elevated persistently in the 160s/100s. She has had some response to labetalol - she has received 20 mg at 1752, 40 mg at 1811, and 80 mg at 1922. Most recent BP was 153/94.   She has received BMZ 1/2 at 1816.   She is being started on Magnesium for seizure prophylaxis.   She had a normal MSASP, horizon and NIPS. Prior to this her only ANC were obesity with BMI 35, and marijuana use in the pregnancy. She is having a baby girl - Julia Phillips    OB History     Gravida  1   Para  0   Term  0   Preterm  0   AB  0   Living  0      SAB  0   IAB  0   Ectopic  0   Multiple  0   Live Births  0            Past Medical History:  Diagnosis Date   Scoliosis     History reviewed. No pertinent surgical history.  Family History  Problem Relation Age of Onset   Diabetes Paternal Grandmother     Social History   Tobacco Use   Smoking status: Former    Types: E-cigarettes    Start date: 01/30/2021    Passive exposure: Yes   Smokeless tobacco: Never  Vaping Use   Vaping Use: Former  Substance Use Topics   Alcohol use: Not Currently    Comment: rare   Drug use: Yes    Types: Marijuana    Comment: last use today 24 Jun 2021    Allergies: No Known Allergies  Medications Prior to Admission  Medication Sig Dispense Refill Last Dose   fluticasone (FLONASE) 50 MCG/ACT nasal spray Place 1 spray into both nostrils daily. 18.2 mL 2 06/30/2021   Prenatal 27-1 MG TABS Take 1 tablet by mouth daily.   06/30/2021   Blood Pressure Monitoring (BLOOD PRESSURE KIT) DEVI 1 Device by Does not apply route once a week. (Patient not taking: Reported on 06/24/2021) 1 each 0  pantoprazole (PROTONIX) 20 MG tablet Take 1 tablet (20 mg total) by mouth daily. (Patient not taking: Reported on 06/24/2021) 30 tablet 5     Review of Systems  Eyes:  Negative for photophobia and visual disturbance.  Cardiovascular:  Negative for chest pain, palpitations and leg swelling.  Gastrointestinal:  Negative for abdominal pain.  Neurological:  Negative for headaches.  All other systems reviewed and are negative. Physical Exam   Blood pressure (!) 163/95, pulse 64, temperature 98.8 F (37.1 C), temperature source Oral, resp. rate 18, height _0  (1.676 m), weight 98.4 kg, last menstrual period 01/02/2021, SpO2 100 %.  Physical Exam Vitals and nursing note reviewed. Exam conducted with a chaperone present.  Constitutional:      Appearance: Normal appearance.  Cardiovascular:     Rate and Rhythm: Normal rate and regular rhythm.     Pulses: Normal pulses.     Heart sounds: Normal heart sounds.  Pulmonary:      Effort: Pulmonary effort is normal.     Breath sounds: Normal breath sounds.  Abdominal:     Comments: Gravid  Skin:    Capillary Refill: Capillary refill takes less than 2 seconds.  Neurological:     Mental Status: She is alert and oriented to person, place, and time.  Psychiatric:        Mood and Affect: Mood normal.        Behavior: Behavior normal.        Thought Content: Thought content normal.        Judgment: Judgment normal.    MAU Course/MDM    Orders Placed This Encounter  Procedures   Resp Panel by RT-PCR (Flu A&B, Covid) Nasopharyngeal Swab   Protein / creatinine ratio, urine   CBC   Comprehensive metabolic panel   RPR   Urinalysis, Routine w reflex microscopic Urine, Clean Catch   Measure blood pressure   Notify physician (specify) Confirmatory reading of BP> 160/110 15 minutes later   Measure blood pressure   Measure blood pressure   Type and screen [place]   Insert peripheral IV   Blood draw with IV start   Patient Vitals for the past 24 hrs:  BP Temp Temp src Pulse Resp SpO2 Height Weight  07/01/21 1945 (!) 163/95 -- -- 64 -- 100 % -- --  07/01/21 1930 (!) 153/94 -- -- 68 -- 100 % -- --  07/01/21 1916 (!) 161/94 -- -- 62 -- -- -- --  07/01/21 1901 (!) 161/100 -- -- 24 -- -- -- --  07/01/21 1850 -- -- -- -- -- 99 % -- --  07/01/21 1846 (!) 145/76 -- -- 66 -- -- -- --  07/01/21 1845 -- -- -- -- -- 98 % -- --  07/01/21 1840 -- -- -- -- -- 98 % -- --  07/01/21 1835 -- -- -- -- -- 98 % -- --  07/01/21 1831 (!) 151/96 -- -- 63 -- -- -- --  07/01/21 1830 -- -- -- -- -- 98 % -- --  07/01/21 1825 -- -- -- -- -- 99 % -- --  07/01/21 1820 -- -- -- -- -- 99 % -- --  07/01/21 1815 -- -- -- -- -- 99 % -- --  07/01/21 1810 -- -- -- -- -- 98 % -- --  07/01/21 1808 (!) 165/92 -- -- 63 -- -- -- --  07/01/21 1801 (!) 165/107 -- -- 67 -- -- -- --  07/01/21 1755 -- -- -- -- --  99 % -- --  07/01/21 1750 -- -- -- -- -- 95 % -- --  07/01/21 1747 (!)  166/107 -- -- 66 -- -- -- --  07/01/21 1745 -- -- -- -- -- 99 % -- --  07/01/21 1740 -- -- -- -- -- 99 % -- --  07/01/21 1735 -- -- -- -- -- 100 % -- --  07/01/21 1734 (!) 166/100 -- -- 73 -- -- -- --  07/01/21 1706 (!) 168/104 98.8 F (37.1 C) Oral 69 18 100 % _0  (1.676 m) 98.4 kg    Results for orders placed or performed during the hospital encounter of 07/01/21 (from the past 24 hour(s))  CBC     Status: Abnormal   Collection Time: 07/01/21  5:25 PM  Result Value Ref Range   WBC 6.4 4.0 - 10.5 K/uL   RBC 4.11 3.87 - 5.11 MIL/uL   Hemoglobin 11.4 (L) 12.0 - 15.0 g/dL   HCT 35.6 (L) 36.0 - 46.0 %   MCV 86.6 80.0 - 100.0 fL   MCH 27.7 26.0 - 34.0 pg   MCHC 32.0 30.0 - 36.0 g/dL   RDW 13.9 11.5 - 15.5 %   Platelets 242 150 - 400 K/uL   nRBC 0.0 0.0 - 0.2 %  Comprehensive metabolic panel     Status: Abnormal   Collection Time: 07/01/21  5:25 PM  Result Value Ref Range   Sodium 136 135 - 145 mmol/L   Potassium 4.0 3.5 - 5.1 mmol/L   Chloride 105 98 - 111 mmol/L   CO2 23 22 - 32 mmol/L   Glucose, Bld 85 70 - 99 mg/dL   BUN 7 6 - 20 mg/dL   Creatinine, Ser 0.64 0.44 - 1.00 mg/dL   Calcium 9.1 8.9 - 10.3 mg/dL   Total Protein 6.4 (L) 6.5 - 8.1 g/dL   Albumin 3.3 (L) 3.5 - 5.0 g/dL   AST 20 15 - 41 U/L   ALT 10 0 - 44 U/L   Alkaline Phosphatase 62 38 - 126 U/L   Total Bilirubin 0.5 0.3 - 1.2 mg/dL   GFR, Estimated >60 >60 mL/min   Anion gap 8 5 - 15  Type and screen [place]     Status: None   Collection Time: 07/01/21  5:50 PM  Result Value Ref Range   ABO/RH(D) O POS    Antibody Screen NEG    Sample Expiration      07/04/2021,2359 Performed at [facility], [address], [place], [place] [address]    [place] MFM OB DETAIL +14 WK  Result Date: 05/15/2021 ----------------------------------------------------------------------  OBSTETRICS REPORT                       (Signed Final 05/15/2021 12:08 pm)  ---------------------------------------------------------------------- Patient Info  ID #:       [id number]                          D.O.B.:  29-Nov-2000 (19 yrs)  Name:       Julia Phillips               Visit Date: 05/15/2021 08:34 am ---------------------------------------------------------------------- Performed By  Attending:        Tama High MD        Ref. Address:     [address]  [place], [place]  Performed By:     Nathen May       [facility]                                     [facility]                                                             [place] [facility]                                                             [facility]  Referred By:      Clarnce Flock MD ---------------------------------------------------------------------- Orders  #  Description                           Code        Ordered By  1  [place] MFM OB DETAIL +14 WK               76811.01    MATTHEW ECKSTAT ----------------------------------------------------------------------  #  Order #                     Accession #                Episode #  1  [id number]                   [id number]                 [id number] ---------------------------------------------------------------------- Indications  Obesity complicating pregnancy, second         O99.212  trimester (BMI 56.3)  Drug use complicating pregnancy, second        J49.702  trimester (Marijuana)  Encounter for antenatal screening for          Z36.3  malformations  [redacted] weeks gestation of pregnancy                Z3A.[place]  LR Panorama ---------------------------------------------------------------------- Fetal Evaluation  Num Of Fetuses:         1  Fetal Heart Rate(bpm):  153  Cardiac Activity:       Observed   Presentation:           Breech  Placenta:               Posterior Fundal  P. Cord Insertion:      Visualized, central  Amniotic Fluid  AFI FV:      Within normal limits                              Largest Pocket(cm)                              4.75 ---------------------------------------------------------------------- Biometry  BPD:      39.8  mm     G. Age:  18w 1d         15  %    CI:        70.83   %    70 - 86                                                          FL/HC:      17.8   %    16.1 - 18.3  HC:      150.7  mm     G. Age:  18w 1d          9  %    HC/AC:      1.15        1.09 - 1.39  AC:      130.6  mm     G. Age:  18w 4d         32  %    FL/BPD:     67.3   %  FL:       26.8  mm     G. Age:  18w 1d         16  %    FL/AC:      20.5   %    20 - 24  HUM:      25.6  mm     G. Age:  18w 0d         23  %  CER:      17.6  mm     G. Age:  17w 4d      < 2.3  %  NFT:       2.3  mm  LV:        5.6  mm  CM:        5.3  mm  Est. FW:     236  gm      0 lb 8 oz     15  % ---------------------------------------------------------------------- Gestational Age  LMP:           19w 0d        Date:  01/02/21                 EDD:   10/09/21  U/S Today:     18w 2d  EDD:   10/14/21  Best:          19w 0d     Det. By:  LMP  (01/02/21)          EDD:   10/09/21 ---------------------------------------------------------------------- Anatomy  Cranium:               Appears normal         LVOT:                   Not well visualized  Cavum:                 Appears normal         Aortic Arch:            Not well visualized  Ventricles:            Appears normal         Ductal Arch:            Not well visualized  Choroid Plexus:        Appears normal         Diaphragm:              Appears normal  Cerebellum:            Appears normal         Stomach:                Appears normal, left                                                                        sided  Posterior Fossa:       Appears  normal         Abdomen:                Appears normal  Nuchal Fold:           Appears normal         Abdominal Wall:         Appears nml (cord                                                                        insert, abd wall)  Face:                  Appears normal         Cord Vessels:           Appears normal (3                         (orbits and profile)                           vessel cord)  Lips:                  Appears normal         Kidneys:  Appear normal  Palate:                Not well visualized    Bladder:                Appears normal  Thoracic:              Appears normal         Spine:                  Appears normal  Heart:                 Not well visualized    Upper Extremities:      Appears normal  RVOT:                  Not well visualized    Lower Extremities:      Appears normal  Other:  Fetus appears to be female. Technically difficult due to maternal          habitus and early GA. Nasal bone, 5th digits, feet, lenses, 3VV          visualized. ---------------------------------------------------------------------- Targeted Anatomy  Thorax  SVC:                   Appears normal         IVC:                    Appears normal ---------------------------------------------------------------------- Cervix Uterus Adnexa  Cervix  Length:           3.97  cm.  Normal appearance by transabdominal scan.  Uterus  No abnormality visualized.  Right Ovary  Size(cm)       3.6  x   2.3    x  2.6       Vol(ml): 11.27  Within normal limits.  Left Ovary  Size(cm)       4.2  x   2.3    x  2.3       Vol(ml): 11.63  Within normal limits.  Cul De Sac  No free fluid seen.  Adnexa  No adnexal mass visualized. ---------------------------------------------------------------------- Impression  G1 P0. Patient is here for fetal anatomy scan.  On cell-free fetal DNA screening, the risks of fetal  aneuploidies are not increased .  We performed fetal anatomy scan. No makers of  aneuploidies or fetal  structural defects are seen. Fetal  biometry is consistent with her previously-established dates.  Amniotic fluid is normal and good fetal activity is seen.  Patient understands the limitations of ultrasound in detecting  fetal anomalies.  As maternal obesity limits resolution of images, failure to  detect anomalies are more common . ---------------------------------------------------------------------- Recommendations  -An appointment was made for her to return in 4 weeks for  completion of fetal anatomy (cardiac anatomy). ----------------------------------------------------------------------                  Tama High, MD Electronically Signed Final Report   05/15/2021 12:08 pm ----------------------------------------------------------------------  [place] MFM OB FOLLOW UP  Result Date: 06/24/2021 ----------------------------------------------------------------------  OBSTETRICS REPORT                       (Signed Final 06/24/2021 03:50 pm) ---------------------------------------------------------------------- Patient Info  ID #:       [id number]  D.O.B.:  2001-04-16 (20 yrs)  Name:       Julia Phillips               Visit Date: 06/24/2021 01:47 pm ---------------------------------------------------------------------- Performed By  Attending:        Johnell Comings MD         Ref. Address:     [address] [place]                                                             [place], [place]  Performed By:     Maryfrances Bunnell      Location:         [facility]                                                             [facility] at                                                             [place] for                                                             Women  Referred By:      Clarnce Flock MD  ---------------------------------------------------------------------- Orders  #  Description                           Code        Ordered By  1  [place] MFM OB FOLLOW UP                   37106.26    RAVI SHANKAR  2  [place] MFM UA CORD DOPPLER                94854.62    [place] ----------------------------------------------------------------------  #  Order #                     Accession #                Episode #  1  [id number]                   [id number]                 [id number]  2  [id number]                   [id number]                 [id number] ---------------------------------------------------------------------- Indications  Obesity complicating pregnancy, second         O99.212  trimester (BMI 91.6)  Drug use complicating pregnancy, second        O99.322  trimester (Marijuana)  [redacted] weeks gestation of pregnancy                Z3A.24  Encounter for antenatal screening for          Z36.3  malformations  LR Panorama ---------------------------------------------------------------------- Fetal Evaluation  Num Of Fetuses:         1  Fetal Heart Rate(bpm):  164  Cardiac Activity:       Observed  Presentation:           Cephalic  Placenta:               Posterior Fundal  P. Cord Insertion:      Visualized, central  Amniotic Fluid  AFI FV:      Within normal limits                              Largest Pocket(cm)                              3.95 ---------------------------------------------------------------------- Biometry  BPD:        56  mm     G. Age:  23w 1d        3.8  %    CI:        74.65   %    70 - 86                                                          FL/HC:      18.9   %    18.7 - 20.3  HC:      205.7  mm     G. Age:  22w 5d        < 1  %    HC/AC:      1.14        1.04 - 1.22  AC:      180.4  mm     G. Age:  22w 6d        3.9  %    FL/BPD:     69.3   %    71 - 87  FL:       38.8  mm     G. Age:  22w 3d  1.1  %    FL/AC:      21.5   %    20 - 24  CER:        27  mm     G. Age:  24w 1d         43   %  LV:        6.4  mm  CM:        6.2  mm  Est. FW:     528  gm      1 lb 3 oz    < 1  % ---------------------------------------------------------------------- Gestational Age  LMP:           24w 5d        Date:  01/02/21                 EDD:   10/09/21  U/S Today:     22w 6d                                        EDD:   10/22/21  Best:          24w 5d     Det. By:  LMP  (01/02/21)          EDD:   10/09/21 ---------------------------------------------------------------------- Anatomy  Cranium:               Appears normal         LVOT:                   Appears normal  Cavum:                 Appears normal         Aortic Arch:            Appears normal  Ventricles:            Appears normal         Ductal Arch:            Appears normal  Choroid Plexus:        Appears normal         Diaphragm:              Appears normal  Cerebellum:            Appears normal         Stomach:                Appears normal, left                                                                        sided  Posterior Fossa:       Appears normal         Abdomen:                Appears normal  Nuchal Fold:           Appears normal         Abdominal Wall:         Appears nml (cord  insert, abd wall)  Face:                  Appears normal         Cord Vessels:           Appears normal (3                         (orbits and profile)                           vessel cord)  Lips:                  Appears normal         Kidneys:                Appear normal  Palate:                Appears normal         Bladder:                Appears normal  Thoracic:              Appears normal         Spine:                  Appears normal  Heart:                 Appears normal         Upper Extremities:      Appears normal                         (4CH, axis, and                         situs)  RVOT:                  Appears normal         Lower Extremities:      Appears normal  Other:   Female gender previously seen. Technically difficult due to maternal          habitus and early GA. Nasal bone, 5th digits, feet, and lenses          previously visualized. VC, 3VV and 3VTV visualized. ---------------------------------------------------------------------- Doppler - Fetal Vessels  Umbilical Artery   S/D     %tile      RI    %tile      PI    %tile            ADFV    RDFV   6.39   > 97.5    0.84       97    1.56   > 97.5              Yes      No ---------------------------------------------------------------------- Cervix Uterus Adnexa  Cervix  Length:           3.58  cm.  Normal appearance by transabdominal scan.  Uterus  No abnormality visualized.  Right Ovary  Within normal limits.  Left Ovary  Not visualized.  Adnexa  No adnexal mass visualized. ---------------------------------------------------------------------- Comments  Robynn Pane was seen for a follow up exam as the views  of the fetal anatomy were unable to be fully visualized during  her last exam.  Her pregnancy  has also been complicated by  maternal obesity with a BMI of 35.8.  She denies any  problems since her last exam.  She was informed that the overall EFW obtained today (1  pound 3 ounces, 528 g) measured at less than the 1st  percentile for her gestational age, indicating IUGR.  There  was normal amniotic fluid noted today.  Doppler studies of the umbilical arteries performed today  showed an elevated S/D ratio with intermittent absent end-  diastolic flow.  There were no signs of reversed end-diastolic  flow noted today.  The views of the fetal anatomy were visualized today.  There  were no obvious anomalies noted.  The limitations of ultrasound in the detection of all anomalies  was discussed.  The following were discussed today:  Intrauterine fetal growth restriction (IUGR)  The implications and management of IUGR was discussed  with the patient and her partner.  They were advised that the  the main risk of IUGR is the  demise of the growth restricted  fetus.  There is also an increased risk of an indicated preterm  birth.  Echogenic areas possibly indicating areas of infarction were  noted throughout the posterior fundal placenta.  The patient  and her partner were advised that placental dysfunction is the  most likely cause of IUGR.  The management of IUGR is intense fetal surveillance  (weekly to twice or even three times a week) using umbilical  artery Doppler studies and NSTs/BPP's (when she reaches  an appropriate gestational age) with the goal of delivery at an  appropriate gestational age to avoid a fetal demise.  The couple were advised that the goal for her delivery would  be at 32-34 weeks or greater. An earlier delivery may be  necessary if reversed end diastolic flow is noted on her  umbilical artery studies.  As she is still early in her pregnancy, we will continue to see  her for weekly ultrasound exams for fetal assessment.  We  will increase the frequency of her visits depending on the  results of her umbilical artery Doppler studies.  They understand that should there be worsening of her  umbilical artery Doppler studies, that hospitalization for twice  daily NSTs may be necessary to help her reach a more  optimal gestational age for delivery.  A follow-up exam was scheduled in 1 week.  We will continue to make further recommendations based on  the results of her future ultrasound exams.  The patient and her partner stated that all of their questions  have been answered.  A total of 25 minutes was spent counseling and coordinating  the care for this patient.  Greater than 50% of the time was  spent in direct face-to-face contact. ----------------------------------------------------------------------                   Johnell Comings, MD Electronically Signed Final Report   06/24/2021 03:50 pm ----------------------------------------------------------------------  [place] MFM OB LIMITED  Result Date:  07/01/2021 ----------------------------------------------------------------------  OBSTETRICS REPORT                       (Signed Final 07/01/2021 04:43 pm) ---------------------------------------------------------------------- Patient Info  ID #:       [id number]                          D.O.B.:  12-22-2000 (20 yrs)  Name:       Julia Phillips  Visit Date: 07/01/2021 03:48 pm ---------------------------------------------------------------------- Performed By  Attending:        Johnell Comings MD         Ref. Address:     [address] [place]                                                             [place], [place]  Performed By:     Maryfrances Bunnell      Location:         [facility]                                                             [facility] at                                                             [place] for                                                             Women  Referred By:      Clarnce Flock MD ---------------------------------------------------------------------- Orders  #  Description                           Code        Ordered By  1  [place] MFM UA CORD DOPPLER                76820.02    YU FANG  2  [place] MFM OB LIMITED                     00867.61    YU FANG ----------------------------------------------------------------------  #  Order #                     Accession #  Episode #  1  [id number]                   [id number]                 [id number]  2  [id number]                   [id number]                 [id number] ---------------------------------------------------------------------- Indications  Obesity complicating pregnancy, second         O99.212  trimester (BMI 63.8)  Drug use complicating pregnancy, 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 Cord Insertion:      Previously Visualized  Amniotic Fluid  AFI FV:      Within normal limits                              Largest Pocket(cm)                              4.36 ---------------------------------------------------------------------- Biometry  LV:          3  mm ---------------------------------------------------------------------- Gestational Age  LMP:           25w 5d        Date:  01/02/21                 EDD:   10/09/21  Best:          Melvyn Novas 5d     Det. By:  LMP  (01/02/21)          EDD:   10/09/21 ---------------------------------------------------------------------- Anatomy  Cranium:               Appears normal         LVOT:                   Previously seen  Cavum:                 Appears normal         Aortic Arch:            Previously seen  Ventricles:            Appears normal         Ductal Arch:            Previously seen  Choroid Plexus:        Previously seen        Diaphragm:              Appears normal  Cerebellum:            Previously seen        Stomach:                Appears normal, left  sided  Posterior Fossa:       Previously seen        Abdomen:                Appears normal  Nuchal Fold:           Not applicable (>65    Abdominal Wall:         Previously seen                         wks GA)  Face:                  Orbits and profile     Cord Vessels:           Previously seen                         previously seen  Lips:                  Previously seen        Kidneys:                Appear normal  Palate:                Previously seen        Bladder:                Appears normal  Thoracic:              Appears normal          Spine:                  Previously seen  Heart:                 Previously seen        Upper Extremities:      Previously seen  RVOT:                  Previously seen        Lower Extremities:      Previously seen  Other:  Female gender previously seen. Technically difficult due to maternal          habitus and early GA. Nasal bone, 5th digits, feet, lenses, VC, 3VV          and 3VTV previously visualized. ---------------------------------------------------------------------- Doppler - Fetal Vessels  Umbilical Artery   S/D     %tile      RI    %tile      PI    %tile     PSV    ADFV    RDFV                                                     (cm/s)   4.47       92    0.78       89    1.27       83     25.3      No      No ---------------------------------------------------------------------- Cervix Uterus Adnexa  Cervix  Length:           3.95  cm.  Normal appearance by transabdominal scan.  Uterus  No abnormality visualized.  Right Ovary  Within normal limits.  Left Ovary  Within normal limits.  Adnexa  No adnexal mass visualized. ---------------------------------------------------------------------- Comments  Robynn Pane was seen due to an IUGR fetus.  Intermittent  absent end-diastolic flow was noted on her umbilical artery  Doppler studies last week.  The overall EFW obtained last  week was 1 pound 3 ounces, 528 g (less than the 1st  percentile).  She denies any problems since her last exam.  She reports feeling fetal movements throughout the day.  Her blood pressures in our office today were 164/101,  159/101, and 160/101.  She denies any signs or symptoms of  preeclampsia.  There was normal amniotic fluid noted on today's ultrasound  exam.  Doppler studies of the umbilical arteries performed due to  fetal growth restriction showed an elevated S/D ratio of 4.47.  There were no signs of absent or reversed end-diastolic flow  noted today.  Multiple echogenic areas were noted in the small appearing  posterior  placenta.  The echogenic areas may represent  areas of infarction which may have caused IUGR with  abnormal umbilical artery Doppler studies.  As women with IUGR and abnormal placental function are at  increased risk for developing preeclampsia, given her  extremely elevated blood pressures noted in our office today,  she was sent to the MAU following today's ultrasound exam  for preeclampsia evaluation.  Should her blood pressures remain extremely elevated and  she is found to have proteinuria or abnormal PIH labs, she  should be admitted for observation and be given a course of  antenatal corticosteroids.  Given her early gestational age, she may be started on  antihypertensive medications (Procardia) so that she can  reach a more optimal gestational age for delivery.  The significance of today's ultrasound findings and her blood  pressures were discussed with the patient.  She stated that  all of her questions have been answered.  Should she be discharged home, she already has another  umbilical artery Doppler study scheduled in our office next  week.  A total of 20 minutes was spent counseling and coordinating  the care for this patient.  Greater than 50% of the time was  spent in direct face-to-face contact. ----------------------------------------------------------------------                   Johnell Comings, MD Electronically Signed Final Report   07/01/2021 04:43 pm ----------------------------------------------------------------------  [place] MFM UA CORD DOPPLER  Result Date: 07/01/2021 ----------------------------------------------------------------------  OBSTETRICS REPORT                       (Signed Final 07/01/2021 04:43 pm) ---------------------------------------------------------------------- Patient Info  ID #:       [id number]                          D.O.B.:  31-Mar-2001 (20 yrs)  Name:       Julia Phillips               Visit Date: 07/01/2021 03:48 pm  ---------------------------------------------------------------------- Performed By  Attending:        Johnell Comings MD         Ref. Address:     [address]  [place], [place]  Performed By:     Maryfrances Bunnell      [facility]                                                             [place] [facility]                                                             [facility]  Referred By:      Clarnce Flock MD ---------------------------------------------------------------------- Orders  #  Description                           Code        Ordered By  1  [place] MFM UA CORD DOPPLER                76820.02    Owen  2  [place] MFM OB LIMITED                     [id number]    YU FANG ----------------------------------------------------------------------  #  Order #                     Accession #                Episode #  1  [id number]                   [id number]                 [id number]  2  [id number]                   [id number]                 [id number] ---------------------------------------------------------------------- Indications  Obesity complicating pregnancy, second         O99.212  trimester (BMI 23.3)  Drug use complicating pregnancy, second        A07.622  trimester (Marijuana)  [redacted] weeks gestation of pregnancy                Z3A.25  LR NIPS ---------------------------------------------------------------------- Fetal Evaluation  Num Of Fetuses:         1  Fetal Heart Rate(bpm):  167  Cardiac Activity:       Observed  Presentation:           Cephalic  Placenta:               Posterior Fundal  P. Cord Insertion:      Previously Visualized  Amniotic Fluid  AFI FV:      Within normal limits                               Largest Pocket(cm)                              4.36 ---------------------------------------------------------------------- Biometry  LV:          3  mm ---------------------------------------------------------------------- Gestational Age  LMP:           25w 5d        Date:  01/02/21                 EDD:   10/09/21  Best:          Melvyn Novas 5d     Det. By:  LMP  (01/02/21)          EDD:   10/09/21 ---------------------------------------------------------------------- Anatomy  Cranium:               Appears normal         LVOT:                   Previously seen  Cavum:                 Appears normal         Aortic Arch:            Previously seen  Ventricles:            Appears normal         Ductal Arch:            Previously seen  Choroid Plexus:        Previously seen        Diaphragm:              Appears normal  Cerebellum:            Previously seen        Stomach:                Appears normal, left                                                                        sided  Posterior Fossa:       Previously seen  Abdomen:                Appears normal  Nuchal Fold:           Not applicable (>92    Abdominal Wall:         Previously seen                         wks GA)  Face:                  Orbits and profile     Cord Vessels:           Previously seen                         previously seen  Lips:                  Previously seen        Kidneys:                Appear normal  Palate:                Previously seen        Bladder:                Appears normal  Thoracic:              Appears normal         Spine:                  Previously seen  Heart:                 Previously seen        Upper Extremities:      Previously seen  RVOT:                  Previously seen        Lower Extremities:      Previously seen  Other:  Female gender previously seen. Technically difficult due to maternal          habitus and early GA. Nasal bone, 5th digits, feet, lenses, VC, 3VV          and 3VTV previously  visualized. ---------------------------------------------------------------------- Doppler - Fetal Vessels  Umbilical Artery   S/D     %tile      RI    %tile      PI    %tile     PSV    ADFV    RDFV                                                     (cm/s)   4.47       92    0.78       89    1.27       83     25.3      No      No ---------------------------------------------------------------------- Cervix Uterus Adnexa  Cervix  Length:           3.95  cm.  Normal appearance by transabdominal scan.  Uterus  No abnormality visualized.  Right Ovary  Within normal limits.  Left Ovary  Within normal limits.  Adnexa  No adnexal mass  visualized. ---------------------------------------------------------------------- Comments  Robynn Pane was seen due to an IUGR fetus.  Intermittent  absent end-diastolic flow was noted on her umbilical artery  Doppler studies last week.  The overall EFW obtained last  week was 1 pound 3 ounces, 528 g (less than the 1st  percentile).  She denies any problems since her last exam.  She reports feeling fetal movements throughout the day.  Her blood pressures in our office today were 164/101,  159/101, and 160/101.  She denies any signs or symptoms of  preeclampsia.  There was normal amniotic fluid noted on today's ultrasound  exam.  Doppler studies of the umbilical arteries performed due to  fetal growth restriction showed an elevated S/D ratio of 4.47.  There were no signs of absent or reversed end-diastolic flow  noted today.  Multiple echogenic areas were noted in the small appearing  posterior placenta.  The echogenic areas may represent  areas of infarction which may have caused IUGR with  abnormal umbilical artery Doppler studies.  As women with IUGR and abnormal placental function are at  increased risk for developing preeclampsia, given her  extremely elevated blood pressures noted in our office today,  she was sent to the MAU following today's ultrasound exam  for preeclampsia  evaluation.  Should her blood pressures remain extremely elevated and  she is found to have proteinuria or abnormal PIH labs, she  should be admitted for observation and be given a course of  antenatal corticosteroids.  Given her early gestational age, she may be started on  antihypertensive medications (Procardia) so that she can  reach a more optimal gestational age for delivery.  The significance of today's ultrasound findings and her blood  pressures were discussed with the patient.  She stated that  all of her questions have been answered.  Should she be discharged home, she already has another  umbilical artery Doppler study scheduled in our office next  week.  A total of 20 minutes was spent counseling and coordinating  the care for this patient.  Greater than 50% of the time was  spent in direct face-to-face contact. ----------------------------------------------------------------------                   Johnell Comings, MD Electronically Signed Final Report   07/01/2021 04:43 pm ---------------------------------------------------------------------- [id number]  Performed By:     Maryfrances Bunnell        Assessment and Plan  --20 y.o. G1P0000 at [redacted]w[redacted]d --Severe Preeclampsia - discussed diagnosis and implications. Discussed inpatient until delivery likely.  --IUGR with elevated dopplers --Reactive tracing/appropriate for GA - no decels --BMZ 1 of 2 given in MAU --Magnesium Sulfate initiated in MAU - she and I discussed Magnesium for seizure prophylaxis.  [facility] APatterson Hammersmithand requested patient transfer if stable. The patient is stable for transfer. [facility] M[facility]aron Stallshas accepted the transfer to their triage. The patient is aware of transfer and consents.   PRadene Gunning11/14/2022, 7:48 PM

## 2021-07-01 NOTE — MAU Provider Note (Signed)
History     CSN: [id number]  Arrival date and time: 07/01/21 1639   Event Date/Time   First Provider Initiated Contact with Patient 07/01/21 1731      Chief Complaint  Patient presents with   Hypertension   HPI Julia Phillips is  a 20 y.o. G1P0000 at 54w5dwho presents to MAU from MFM for evaluation of new onset HTN. Patient had readings of 164/101, 159/101, and 160/101. She denies history of HTN. On arrival to MAU she denies headache, visual disturbances, RUQ/epigastric pain, new onset swelling or weight gain.  Patient receives care with [facility] and participates in the [facility] program.   OB History     Gravida  1   Para  0   Term  0   Preterm  0   AB  0   Living  0      SAB  0   IAB  0   Ectopic  0   Multiple  0   Live Births  0           Past Medical History:  Diagnosis Date   Scoliosis     No past surgical history on file.  Family History  Problem Relation Age of Onset   Diabetes Paternal Grandmother     Social History   Tobacco Use   Smoking status: Former    Types: E-cigarettes    Start date: 01/30/2021    Passive exposure: Yes   Smokeless tobacco: Never  Vaping Use   Vaping Use: Former  Substance Use Topics   Alcohol use: Not Currently    Comment: rare   Drug use: Yes    Types: Marijuana    Comment: last use today 24 Jun 2021    Allergies: No Known Allergies  Medications Prior to Admission  Medication Sig Dispense Refill Last Dose   Blood Pressure Monitoring (BLOOD PRESSURE KIT) DEVI 1 Device by Does not apply route once a week. (Patient not taking: Reported on 06/24/2021) 1 each 0    fluticasone (FLONASE) 50 MCG/ACT nasal spray Place 1 spray into both nostrils daily. 18.2 mL 2    pantoprazole (PROTONIX) 20 MG tablet Take 1 tablet (20 mg total) by mouth daily. (Patient not taking: Reported on 06/24/2021) 30 tablet 5    Prenatal 27-1 MG TABS Take 1 tablet by mouth daily.       Review of Systems  Eyes:  Negative for  photophobia and visual disturbance.  Cardiovascular:  Negative for chest pain, palpitations and leg swelling.  Gastrointestinal:  Negative for abdominal pain.  Neurological:  Negative for headaches.  All other systems reviewed and are negative. Physical Exam   Blood pressure (!) 168/104, pulse 69, temperature 98.8 F (37.1 C), temperature source Oral, resp. rate 18, height 5' 6" (1.676 m), weight 98.4 kg, last menstrual period 01/02/2021, SpO2 100 %.  Physical Exam Vitals and nursing note reviewed. Exam conducted with a chaperone present.  Constitutional:      Appearance: Normal appearance.  Cardiovascular:     Rate and Rhythm: Normal rate and regular rhythm.     Pulses: Normal pulses.     Heart sounds: Normal heart sounds.  Pulmonary:     Effort: Pulmonary effort is normal.     Breath sounds: Normal breath sounds.  Abdominal:     Comments: Gravid  Skin:    Capillary Refill: Capillary refill takes less than 2 seconds.  Neurological:     Mental Status: She is alert and oriented  to person, place, and time.  Psychiatric:        Mood and Affect: Mood normal.        Behavior: Behavior normal.        Thought Content: Thought content normal.        Judgment: Judgment normal.    MAU Course/MDM  Procedures  Orders Placed This Encounter  Procedures   Resp Panel by RT-PCR (Flu A&B, Covid) Nasopharyngeal Swab   Protein / creatinine ratio, urine   CBC   Comprehensive metabolic panel   RPR   Urinalysis, Routine w reflex microscopic Urine, Clean Catch   Measure blood pressure   Notify physician (specify) Confirmatory reading of BP> 160/110 15 minutes later   Measure blood pressure   Measure blood pressure   Type and screen [place]   Insert peripheral IV   Blood draw with IV start   Patient Vitals for the past 24 hrs:  BP Temp Temp src Pulse Resp SpO2 Height Weight  07/01/21 2101 (!) 151/82 -- -- 90 -- -- -- --  07/01/21 2045 (!) 149/89 -- -- 89 -- 99 % --  --  07/01/21 2032 (!) 147/90 98.2 F (36.8 C) Oral 89 20 99 % -- --  07/01/21 2021 (!) 145/83 98.1 F (36.7 C) Oral 85 20 100 % -- --  07/01/21 2005 (!) 154/92 98.1 F (36.7 C) Oral 76 20 100 % -- --  07/01/21 1945 (!) 163/95 -- -- 64 -- 100 % -- --  07/01/21 1930 (!) 153/94 -- -- 68 -- 100 % -- --  07/01/21 1916 (!) 161/94 -- -- 62 -- -- -- --  07/01/21 1901 (!) 161/100 -- -- 49 -- -- -- --  07/01/21 1850 -- -- -- -- -- 99 % -- --  07/01/21 1846 (!) 145/76 -- -- 66 -- -- -- --  07/01/21 1845 -- -- -- -- -- 98 % -- --  07/01/21 1840 -- -- -- -- -- 98 % -- --  07/01/21 1835 -- -- -- -- -- 98 % -- --  07/01/21 1831 (!) 151/96 -- -- 63 -- -- -- --  07/01/21 1830 -- -- -- -- -- 98 % -- --  07/01/21 1825 -- -- -- -- -- 99 % -- --  07/01/21 1820 -- -- -- -- -- 99 % -- --  07/01/21 1815 -- -- -- -- -- 99 % -- --  07/01/21 1810 -- -- -- -- -- 98 % -- --  07/01/21 1808 (!) 165/92 -- -- 63 -- -- -- --  07/01/21 1801 (!) 165/107 -- -- 67 -- -- -- --  07/01/21 1755 -- -- -- -- -- 99 % -- --  07/01/21 1750 -- -- -- -- -- 95 % -- --  07/01/21 1747 (!) 166/107 -- -- 66 -- -- -- --  07/01/21 1745 -- -- -- -- -- 99 % -- --  07/01/21 1740 -- -- -- -- -- 99 % -- --  07/01/21 1735 -- -- -- -- -- 100 % -- --  07/01/21 1734 (!) 166/100 -- -- 73 -- -- -- --  07/01/21 1706 (!) 168/104 98.8 F (37.1 C) Oral 69 18 100 % 5' 6" (1.676 m) 98.4 kg   Results for orders placed or performed during the hospital encounter of 07/01/21 (from the past 24 hour(s))  CBC     Status: Abnormal   Collection Time: 07/01/21  5:25 PM  Result Value Ref Range   WBC 6.4  4.0 - 10.5 K/uL   RBC 4.11 3.87 - 5.11 MIL/uL   Hemoglobin 11.4 (L) 12.0 - 15.0 g/dL   HCT 35.6 (L) 36.0 - 46.0 %   MCV 86.6 80.0 - 100.0 fL   MCH 27.7 26.0 - 34.0 pg   MCHC 32.0 30.0 - 36.0 g/dL   RDW 13.9 11.5 - 15.5 %   Platelets 242 150 - 400 K/uL   nRBC 0.0 0.0 - 0.2 %  Comprehensive metabolic panel     Status: Abnormal   Collection Time:  07/01/21  5:25 PM  Result Value Ref Range   Sodium 136 135 - 145 mmol/L   Potassium 4.0 3.5 - 5.1 mmol/L   Chloride 105 98 - 111 mmol/L   CO2 23 22 - 32 mmol/L   Glucose, Bld 85 70 - 99 mg/dL   BUN 7 6 - 20 mg/dL   Creatinine, Ser 0.64 0.44 - 1.00 mg/dL   Calcium 9.1 8.9 - 10.3 mg/dL   Total Protein 6.4 (L) 6.5 - 8.1 g/dL   Albumin 3.3 (L) 3.5 - 5.0 g/dL   AST 20 15 - 41 U/L   ALT 10 0 - 44 U/L   Alkaline Phosphatase 62 38 - 126 U/L   Total Bilirubin 0.5 0.3 - 1.2 mg/dL   GFR, Estimated >60 >60 mL/min   Anion gap 8 5 - 15  Type and screen [place]     Status: None (Preliminary result)   Collection Time: 07/01/21  5:50 PM  Result Value Ref Range   ABO/RH(D) PENDING    Antibody Screen PENDING    Sample Expiration      07/04/2021,2359 Performed at [facility], [address], [place], [place] [address]    Assessment and Plan  --20 y.o. G1P0000 at [redacted]w[redacted]d --Severe Preeclampsia --IUGR  --Reactive tracing --BMZ 1 of 2 given in MAU --Magnesium Sulfate initiated in MAU --[facility] AP[facility]terson Hammersmith--Dr. DDamita Dunningscoordinating transfer to outside facility  SDarlina Rumpf MHolcomb MSN, CNM 07/01/2021, 9:17 PM

## 2021-07-01 NOTE — MAU Note (Signed)
[facility] team at bedside.

## 2021-07-01 NOTE — MAU Note (Signed)
[facility] called and was told to call [facility] since they are receiving the patient. [facility] also does not have truck available currently. Just had truck leave out for [facility] [employer] called and obtained all patient info needed for transport.[facility] will call MAU when truck available

## 2021-07-02 LAB — RPR: RPR Ser Ql: NONREACTIVE

## 2021-07-04 ENCOUNTER — Encounter: Payer: Self-pay | Admitting: Family Medicine

## 2021-07-08 ENCOUNTER — Ambulatory Visit: Payer: Medicaid Other

## 2021-07-08 ENCOUNTER — Ambulatory Visit: Payer: Medicaid Other | Attending: Obstetrics

## 2021-07-16 ENCOUNTER — Ambulatory Visit: Payer: Medicaid Other

## 2021-07-22 ENCOUNTER — Other Ambulatory Visit: Payer: Self-pay | Admitting: *Deleted

## 2021-07-22 DIAGNOSIS — Z3401 Encounter for supervision of normal first pregnancy, first trimester: Secondary | ICD-10-CM

## 2021-07-23 ENCOUNTER — Ambulatory Visit: Payer: Medicaid Other

## 2021-07-26 ENCOUNTER — Encounter (HOSPITAL_COMMUNITY): Payer: Self-pay | Admitting: Family Medicine

## 2021-07-26 ENCOUNTER — Ambulatory Visit: Payer: Medicaid Other | Admitting: Family Medicine

## 2021-07-26 ENCOUNTER — Other Ambulatory Visit: Payer: Self-pay

## 2021-07-26 ENCOUNTER — Other Ambulatory Visit: Payer: Medicaid Other

## 2021-07-26 ENCOUNTER — Inpatient Hospital Stay (HOSPITAL_COMMUNITY)
Admission: EM | Admit: 2021-07-26 | Discharge: 2021-07-26 | Disposition: A | Discharge status: Home / Self Care | Payer: Medicaid Other | Attending: Family Medicine | Admitting: Family Medicine

## 2021-07-26 DIAGNOSIS — R5383 Other fatigue: Secondary | ICD-10-CM | POA: Insufficient documentation

## 2021-07-26 DIAGNOSIS — R519 Headache, unspecified: Secondary | ICD-10-CM | POA: Insufficient documentation

## 2021-07-26 DIAGNOSIS — R202 Paresthesia of skin: Secondary | ICD-10-CM | POA: Insufficient documentation

## 2021-07-26 DIAGNOSIS — Z79899 Other long term (current) drug therapy: Secondary | ICD-10-CM | POA: Insufficient documentation

## 2021-07-26 DIAGNOSIS — M419 Scoliosis, unspecified: Secondary | ICD-10-CM | POA: Diagnosis not present

## 2021-07-26 DIAGNOSIS — R531 Weakness: Secondary | ICD-10-CM | POA: Insufficient documentation

## 2021-07-26 DIAGNOSIS — O1410 Severe pre-eclampsia, unspecified trimester: Secondary | ICD-10-CM

## 2021-07-26 DIAGNOSIS — O165 Unspecified maternal hypertension, complicating the puerperium: Secondary | ICD-10-CM | POA: Diagnosis present

## 2021-07-26 DIAGNOSIS — Z87891 Personal history of nicotine dependence: Secondary | ICD-10-CM | POA: Diagnosis not present

## 2021-07-26 DIAGNOSIS — R0789 Other chest pain: Secondary | ICD-10-CM | POA: Insufficient documentation

## 2021-07-26 HISTORY — DX: Depression, unspecified: F32.A

## 2021-07-26 HISTORY — DX: Gestational (pregnancy-induced) hypertension without significant proteinuria, unspecified trimester: O13.9

## 2021-07-26 LAB — URINALYSIS, ROUTINE W REFLEX MICROSCOPIC
Bacteria, UA: NONE SEEN
Bilirubin Urine: NEGATIVE
Glucose, UA: NEGATIVE mg/dL
Ketones, ur: NEGATIVE mg/dL
Leukocytes,Ua: NEGATIVE
Nitrite: NEGATIVE
Protein, ur: 100 mg/dL — AB
Specific Gravity, Urine: 1.008 (ref 1.005–1.030)
pH: 6 (ref 5.0–8.0)

## 2021-07-26 LAB — CBC
HCT: 38.4 % (ref 36.0–46.0)
Hemoglobin: 12.7 g/dL (ref 12.0–15.0)
MCH: 28.4 pg (ref 26.0–34.0)
MCHC: 33.1 g/dL (ref 30.0–36.0)
MCV: 85.9 fL (ref 80.0–100.0)
Platelets: 472 10*3/uL — ABNORMAL HIGH (ref 150–400)
RBC: 4.47 MIL/uL (ref 3.87–5.11)
RDW: 13.8 % (ref 11.5–15.5)
WBC: 4.9 10*3/uL (ref 4.0–10.5)
nRBC: 0 % (ref 0.0–0.2)

## 2021-07-26 LAB — COMPREHENSIVE METABOLIC PANEL
ALT: 17 U/L (ref 0–44)
AST: 20 U/L (ref 15–41)
Albumin: 4.1 g/dL (ref 3.5–5.0)
Alkaline Phosphatase: 78 U/L (ref 38–126)
Anion gap: 11 (ref 5–15)
BUN: 5 mg/dL — ABNORMAL LOW (ref 6–20)
CO2: 23 mmol/L (ref 22–32)
Calcium: 9.4 mg/dL (ref 8.9–10.3)
Chloride: 102 mmol/L (ref 98–111)
Creatinine, Ser: 0.96 mg/dL (ref 0.44–1.00)
GFR, Estimated: >60 mL/min (ref >60)
Glucose, Bld: 87 mg/dL (ref 70–99)
Potassium: 3.7 mmol/L (ref 3.5–5.1)
Sodium: 136 mmol/L (ref 135–145)
Total Bilirubin: 0.7 mg/dL (ref 0.3–1.2)
Total Protein: 7.6 g/dL (ref 6.5–8.1)

## 2021-07-26 MED ORDER — NIFEDIPINE ER OSMOTIC RELEASE 60 MG PO TB24
60.0000 mg | ORAL_TABLET | Freq: Two times a day (BID) | ORAL | 0 refills | Status: DC
Start: 2021-07-26 — End: 2021-11-12

## 2021-07-26 MED ORDER — NIFEDIPINE ER OSMOTIC RELEASE 30 MG PO TB24
60.0000 mg | ORAL_TABLET | Freq: Once | ORAL | Status: AC
  Administered 2021-07-26: 60 mg via ORAL
  Filled 2021-07-26: qty 2

## 2021-07-26 MED ORDER — SODIUM CHLORIDE 0.9 % IV SOLN
25.0000 mg | Freq: Once | INTRAVENOUS | Status: AC
  Administered 2021-07-26: 25 mg via INTRAVENOUS
  Filled 2021-07-26: qty 1

## 2021-07-26 MED ORDER — LACTATED RINGERS IV BOLUS
1000.0000 mL | Freq: Once | INTRAVENOUS | Status: AC
  Administered 2021-07-26: 1000 mL via INTRAVENOUS

## 2021-07-26 MED ORDER — DEXAMETHASONE SODIUM PHOSPHATE 10 MG/ML IJ SOLN
10.0000 mg | Freq: Once | INTRAMUSCULAR | Status: AC
  Administered 2021-07-26: 10 mg via INTRAVENOUS
  Filled 2021-07-26: qty 1

## 2021-07-26 NOTE — ED Triage Notes (Addendum)
Pt sent here for proteinuria >300 w/ HTN. Pt postpartum, delivered mid-November at 27 weeks

## 2021-07-26 NOTE — MAU Note (Signed)
Presents with c/o H/A, right sided chest pain, and weakness/numbness  in right arm.  S/P Cesarean Section 07/12/2021  for fetal distress and elevated BP.  Denies taking meds for H/A.  Endorses visual disturbances  or current epigastric pain.   Currently taking Procardia & Labetalol for increased BP.

## 2021-07-26 NOTE — MAU Provider Note (Signed)
History     CSN: [id number]  Arrival date and time: 07/26/21 1240   Event Date/Time   First Provider Initiated Contact with Patient 07/26/21 1535      Chief Complaint  Patient presents with   Hypertension   Headache   Chest Pain   HPI  Julia Phillips is a 20 y.o. G1P0101 postop patient who presents to MAU with chief complaint of headache, right upper chest pain and right arm weakness in the setting of hx of severe preeclampsia. Patient states all complaints were new onset yesterday.   Headache Pain is anterior, does not radiate. Pain score is 6/10. Patient denies aggravating or alleviating factors. She has not taken medication or tried other treatments for this complaint. She denies blurry vision, RUQ/epigastric pain, new onset swelling or weight gain.  Chest Pain Patient endorses feeling "sore" in her right upper chest. She denies pain along the line of her sternum. She denies pain on the left side of her chest. She denies palpitations, weakness, syncope. She denies personal history of cardiac concerns.   Right Arm "Tiredness" and "Tingling" Patient endorses generalized fatigue in her right arm. She feels as if her fingers are a different color when her blood pressure is elevated. She denies difference in functionality. She denies difficulty performing ADLs.  Patient reports being prescribed blood pressure medicine. She states she takes "Procardia 60 XL in the morning and Labetalol 200 mg at bedtime". She most recently took Labetalol last night and Procardia yesterday morning.  Patient is s/p primary preterm cesarean for breech presentation in the setting of Severe PEC on 07/12/2021.   OB History     Gravida  1   Para  1   Term  0   Preterm  1   AB  0   Living  1      SAB  0   IAB  0   Ectopic  0   Multiple  0   Live Births  1           Past Medical History:  Diagnosis Date   Depression    Pregnancy induced hypertension    Scoliosis     Past  Surgical History:  Procedure Laterality Date   CESAREAN SECTION      Family History  Problem Relation Age of Onset   Diabetes Mother    Diabetes Paternal Grandmother     Social History   Tobacco Use   Smoking status: Former    Types: E-cigarettes    Start date: 01/30/2021    Passive exposure: Yes   Smokeless tobacco: Never  Vaping Use   Vaping Use: Former  Substance Use Topics   Alcohol use: Not Currently    Comment: rare   Drug use: Yes    Types: Marijuana    Comment: last use today 24 Jun 2021    Allergies: No Known Allergies  Medications Prior to Admission  Medication Sig Dispense Refill Last Dose   fluticasone (FLONASE) 50 MCG/ACT nasal spray Place 1 spray into both nostrils daily. 18.2 mL 2 Past Month   Blood Pressure Monitoring (BLOOD PRESSURE KIT) DEVI 1 Device by Does not apply route once a week. (Patient not taking: Reported on 06/24/2021) 1 each 0    pantoprazole (PROTONIX) 20 MG tablet Take 1 tablet (20 mg total) by mouth daily. (Patient not taking: No sig reported) 30 tablet 5    Prenatal 27-1 MG TABS Take 1 tablet by mouth daily.  Review of Systems  Constitutional:  Positive for fatigue.  Cardiovascular:  Positive for chest pain.  Neurological:  Positive for headaches.  All other systems reviewed and are negative. Physical Exam   Blood pressure (!) 148/105, pulse 76, temperature 98.3 F (36.8 C), temperature source Oral, resp. rate 20, weight 88.5 kg, SpO2 100 %, unknown if currently breastfeeding.  Physical Exam Vitals and nursing note reviewed.  Constitutional:      Appearance: She is well-developed. She is not ill-appearing.  Cardiovascular:     Rate and Rhythm: Normal rate and regular rhythm.     Heart sounds: Normal heart sounds.  Pulmonary:     Effort: Pulmonary effort is normal.     Breath sounds: Normal breath sounds.  Abdominal:     Palpations: Abdomen is soft.     Comments: LTCS incision present. No redness, streaking, or discharge   Musculoskeletal:        General: Normal range of motion.     Comments: Grip strength equal bilaterally  Skin:    General: Skin is warm and dry.     Capillary Refill: Capillary refill takes less than 2 seconds.  Neurological:     Mental Status: She is alert.     Cranial Nerves: No cranial nerve deficit or facial asymmetry.     Sensory: No sensory deficit.     Motor: No weakness.     Coordination: Coordination normal.     Gait: Gait normal.  Psychiatric:        Mood and Affect: Mood normal.        Speech: Speech normal.        Behavior: Behavior normal.    MAU Course/MDM  Procedures  --EMR reviewed, records from inpatient stay visible in Care Everywhere. Patient taking bp meds differently than prescribed.  --Headache resolved with Phenergan bag. Patient sleeping at 1710 --Target response to Procardia 60XL. Discussed with Dr. Kennon Rounds. Will d/c Labetalol. Confirmed new regimen with patient and partner, now Procardia 60XL BID --Patient declines Neuro consult, requests discharge home. CNM emphasized that patient should return to MAU if tingling worsens or begins to impact functionality. Outpatient Amb referral to Neuro ordered  Patient Vitals for the past 24 hrs:  BP Temp Temp src Pulse Resp SpO2 Weight  07/26/21 1731 110/60 -- -- 70 15 98 % --  07/26/21 1716 112/66 -- -- 69 -- -- --  07/26/21 1700 122/72 -- -- 72 -- 100 % --  07/26/21 1646 (!) 106/93 -- -- 80 -- -- --  07/26/21 1631 (!) 144/90 -- -- 75 -- -- --  07/26/21 1616 (!) 144/90 -- -- 71 -- -- --  07/26/21 1545 (!) 141/100 -- -- 72 -- 100 % --  07/26/21 1531 (!) 148/105 -- -- 76 -- -- --  07/26/21 1522 (!) 151/107 98.3 F (36.8 C) Oral 85 20 100 % --  07/26/21 1511 -- -- -- -- -- -- 88.5 kg  07/26/21 1252 (!) 139/107 97.9 F (36.6 C) -- 78 20 100 % --   Meds ordered this encounter  Medications   NIFEdipine (PROCARDIA-XL/NIFEDICAL-XL) 24 hr tablet 60 mg    Hx severe preeclampsia, s/p cesarean 11/25   lactated ringers  bolus 1,000 mL   promethazine (PHENERGAN) 25 mg in sodium chloride 0.9 % 50 mL IVPB   dexamethasone (DECADRON) injection 10 mg   NIFEdipine (PROCARDIA XL) 60 MG 24 hr tablet    Sig: Take 1 tablet (60 mg total) by mouth 2 (two) times daily.  Dispense:  60 tablet    Refill:  0    Order Specific Question:   Supervising Provider    Answer:   Donnamae Jude [[id number]]   Assessment and Plan  --20 y.o. G1P0101 s/p cesarean on 07/12/2021 --Revised medication regimen, normal PEC labs --Right arm parasthesia w/o impact to functionality --Normal ECG --Pain score 0/10 prior to discharge --Discharge home in stable condition  F/U: Message sent to [facility] to schedule BP check Monday or Tuesday Patient instructed to return to MAU for BP check if office unable to accommodate Amb referral to Neurology  Darlina Rumpf, CNM 07/26/2021, 7:16 PM

## 2021-07-26 NOTE — ED Provider Notes (Signed)
Emergency Medicine Provider Triage Evaluation Note  Julia Phillips , a 20 y.o. female  was evaluated in triage.  Pt complains of generalized headache and "tiredness" of the RUE. Recent delivery in mid-November. Sent over from Urgent Care for >300 protein in urine  Review of Systems  Positive: Headache, fatigue Negative: Chest pain, SOB, urinary complaints.  Physical Exam  BP (!) 139/107 (BP Location: Right Arm)   Pulse 78   Temp 97.9 F (36.6 C)   Resp 20   LMP 01/02/2021   SpO2 100%  Gen:   Awake, no distress   Resp:  Normal effort  MSK:   Moves extremities without difficulty  Other:  Strength 5/5, sensation intact, compartments soft. Cranial nerves II-XII. Cerebellum.   Medical Decision Making  Medically screening exam initiated at 1:40 PM.  Appropriate orders placed.  Haze Justin was informed that the remainder of the evaluation will be completed by another provider, this initial triage assessment does not replace that evaluation, and the importance of remaining in the ED until their evaluation is complete.  MAU called and the patient will be sent up    Achille Rich, PA-C 07/26/21 1346    Trifan, Kermit Balo, MD 07/26/21 [phone]

## 2021-07-27 ENCOUNTER — Inpatient Hospital Stay (HOSPITAL_COMMUNITY): Payer: Medicaid Other

## 2021-07-27 ENCOUNTER — Other Ambulatory Visit: Payer: Self-pay

## 2021-07-27 ENCOUNTER — Inpatient Hospital Stay (HOSPITAL_COMMUNITY)
Admission: AD | Admit: 2021-07-27 | Discharge: 2021-07-28 | Disposition: A | Discharge status: Home / Self Care | Payer: Medicaid Other | Source: Ambulatory Visit | Attending: Obstetrics and Gynecology | Admitting: Obstetrics and Gynecology

## 2021-07-27 ENCOUNTER — Encounter (HOSPITAL_COMMUNITY): Payer: Self-pay | Admitting: Obstetrics and Gynecology

## 2021-07-27 DIAGNOSIS — O9089 Other complications of the puerperium, not elsewhere classified: Secondary | ICD-10-CM | POA: Diagnosis not present

## 2021-07-27 DIAGNOSIS — O99335 Smoking (tobacco) complicating the puerperium: Secondary | ICD-10-CM | POA: Insufficient documentation

## 2021-07-27 DIAGNOSIS — O99893 Other specified diseases and conditions complicating puerperium: Secondary | ICD-10-CM | POA: Diagnosis not present

## 2021-07-27 DIAGNOSIS — F17290 Nicotine dependence, other tobacco product, uncomplicated: Secondary | ICD-10-CM | POA: Insufficient documentation

## 2021-07-27 DIAGNOSIS — R200 Anesthesia of skin: Secondary | ICD-10-CM | POA: Insufficient documentation

## 2021-07-27 DIAGNOSIS — H538 Other visual disturbances: Secondary | ICD-10-CM | POA: Diagnosis not present

## 2021-07-27 DIAGNOSIS — R519 Headache, unspecified: Secondary | ICD-10-CM | POA: Diagnosis not present

## 2021-07-27 DIAGNOSIS — O165 Unspecified maternal hypertension, complicating the puerperium: Secondary | ICD-10-CM | POA: Diagnosis not present

## 2021-07-27 LAB — CBC WITH DIFFERENTIAL/PLATELET
Abs Immature Granulocytes: 0.03 10*3/uL (ref 0.00–0.07)
Basophils Absolute: 0 10*3/uL (ref 0.0–0.1)
Basophils Relative: 0 %
Eosinophils Absolute: 0 10*3/uL (ref 0.0–0.5)
Eosinophils Relative: 0 %
HCT: 34.8 % — ABNORMAL LOW (ref 36.0–46.0)
Hemoglobin: 12 g/dL (ref 12.0–15.0)
Immature Granulocytes: 0 %
Lymphocytes Relative: 44 %
Lymphs Abs: 3.3 10*3/uL (ref 0.7–4.0)
MCH: 29.1 pg (ref 26.0–34.0)
MCHC: 34.5 g/dL (ref 30.0–36.0)
MCV: 84.5 fL (ref 80.0–100.0)
Monocytes Absolute: 0.5 10*3/uL (ref 0.1–1.0)
Monocytes Relative: 6 %
Neutro Abs: 3.6 10*3/uL (ref 1.7–7.7)
Neutrophils Relative %: 50 %
Platelets: 456 10*3/uL — ABNORMAL HIGH (ref 150–400)
RBC: 4.12 MIL/uL (ref 3.87–5.11)
RDW: 13.9 % (ref 11.5–15.5)
WBC: 7.5 10*3/uL (ref 4.0–10.5)
nRBC: 0 % (ref 0.0–0.2)

## 2021-07-27 LAB — COMPREHENSIVE METABOLIC PANEL
ALT: 14 U/L (ref 0–44)
AST: 18 U/L (ref 15–41)
Albumin: 3.9 g/dL (ref 3.5–5.0)
Alkaline Phosphatase: 67 U/L (ref 38–126)
Anion gap: 10 (ref 5–15)
BUN: 9 mg/dL (ref 6–20)
CO2: 22 mmol/L (ref 22–32)
Calcium: 9.3 mg/dL (ref 8.9–10.3)
Chloride: 108 mmol/L (ref 98–111)
Creatinine, Ser: 0.92 mg/dL (ref 0.44–1.00)
GFR, Estimated: >60 mL/min (ref >60)
Glucose, Bld: 86 mg/dL (ref 70–99)
Potassium: 3.2 mmol/L — ABNORMAL LOW (ref 3.5–5.1)
Sodium: 140 mmol/L (ref 135–145)
Total Bilirubin: 0.4 mg/dL (ref 0.3–1.2)
Total Protein: 7.1 g/dL (ref 6.5–8.1)

## 2021-07-27 IMAGING — MR MR MRA HEAD W/O CM
1 series · 19 of 48 positions shown · non-contrast
Comparison: None available.

CLINICAL DATA: Initial evaluation for persistent headache and
blurry vision, right-sided numbness and tingling. 2 weeks postpartum
with preeclampsia.

EXAM:
MRA HEAD WITHOUT CONTRAST
TECHNIQUE: Angiographic images of the Circle of Willis were acquired using MRA
technique without intravenous contrast.

[Series 1: 3d cow · axial · 0.5mm · 0.41mm/px · z∈[-147,-69]mm · 19 of 172 slices shown]
[im 1/172]
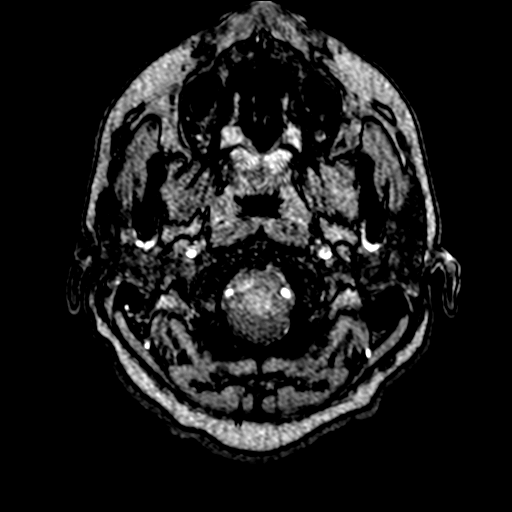
[im 4/172]
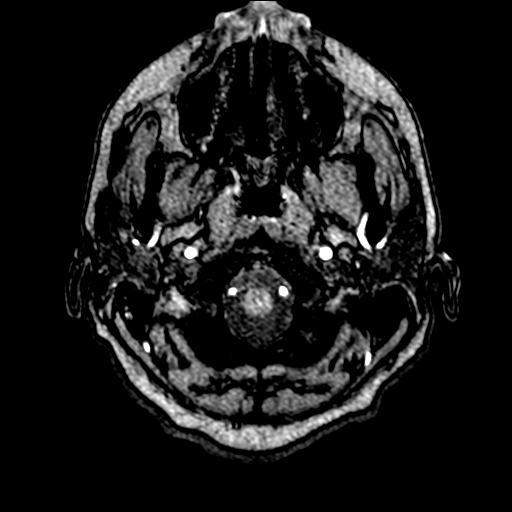
[im 8/172]
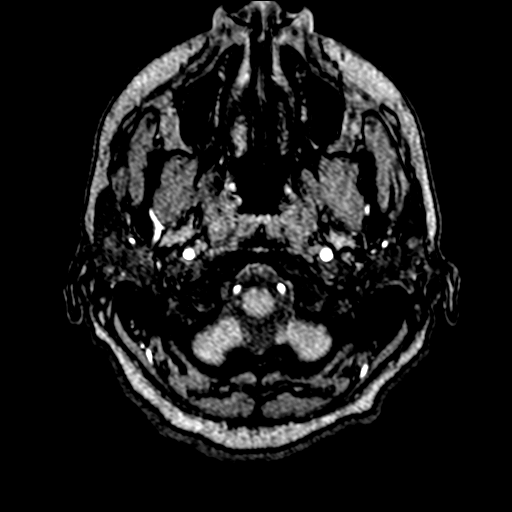
[im 11/172]
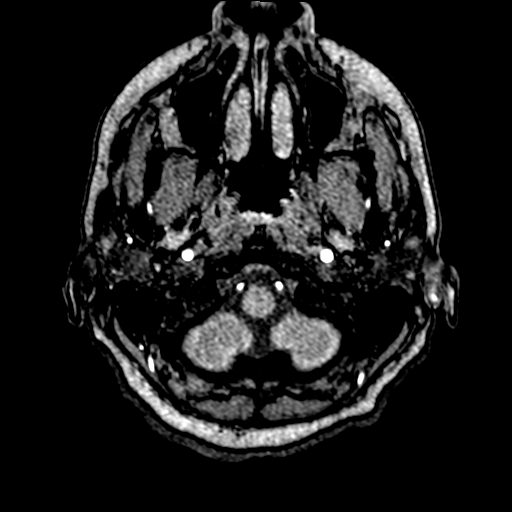
[im 15/172]
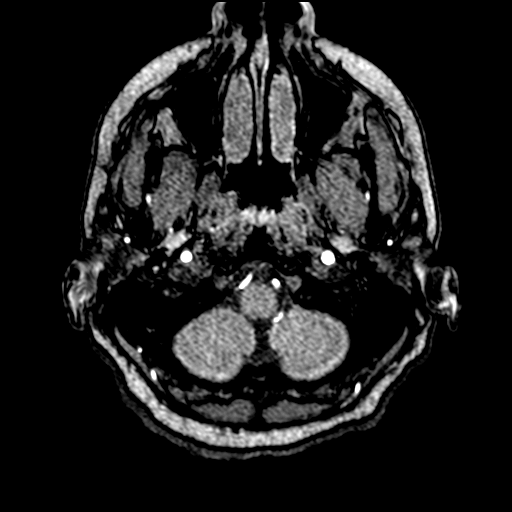
[im 19/172]
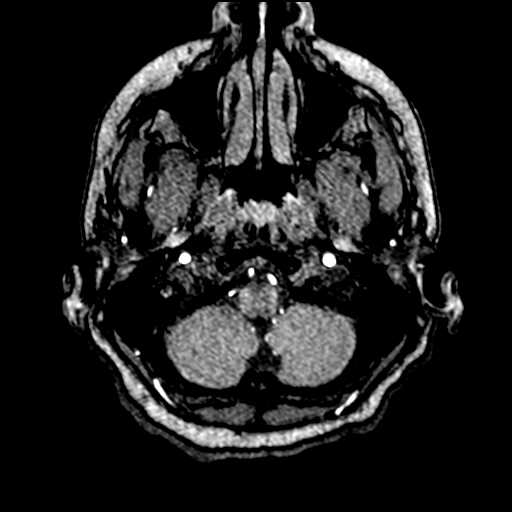
[im 22/172]
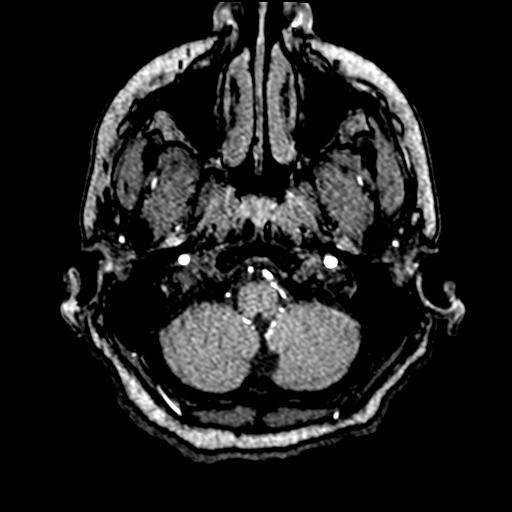
[im 26/172]
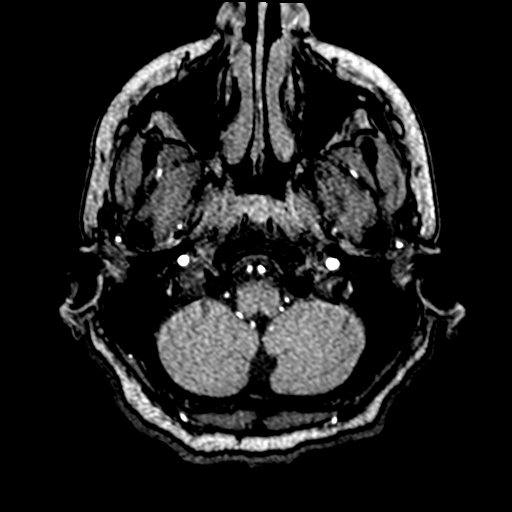
[im 30/172]
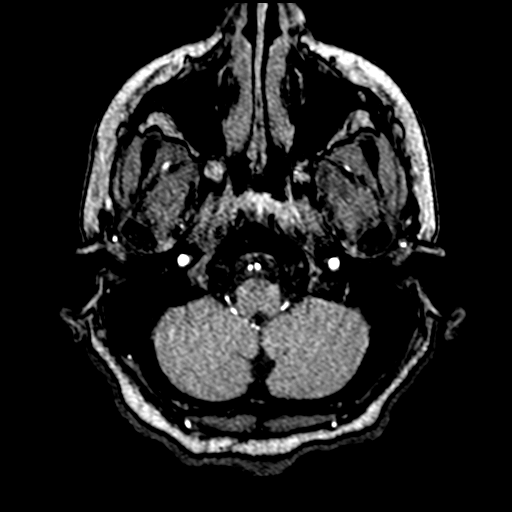
[im 33/172]
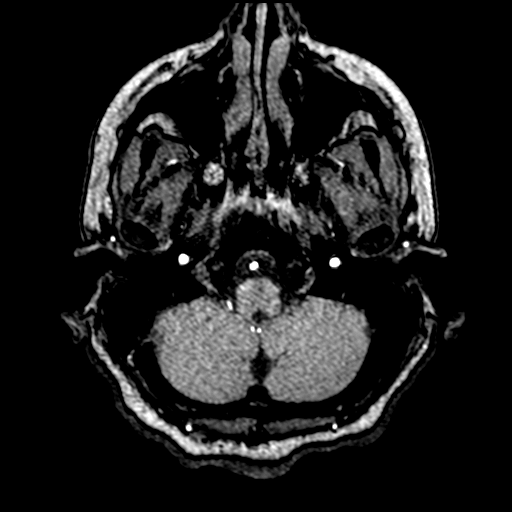
[im 37/172]
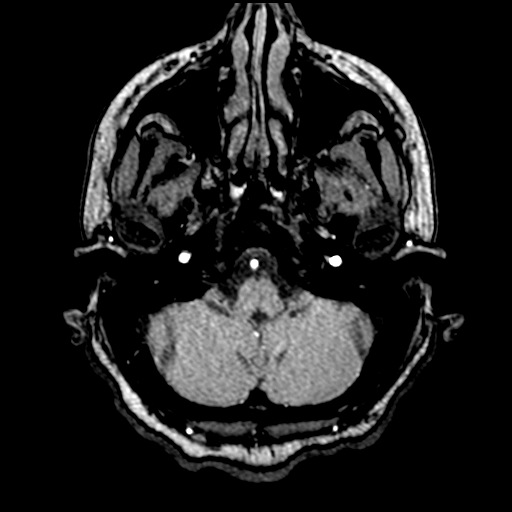
[im 55/172]
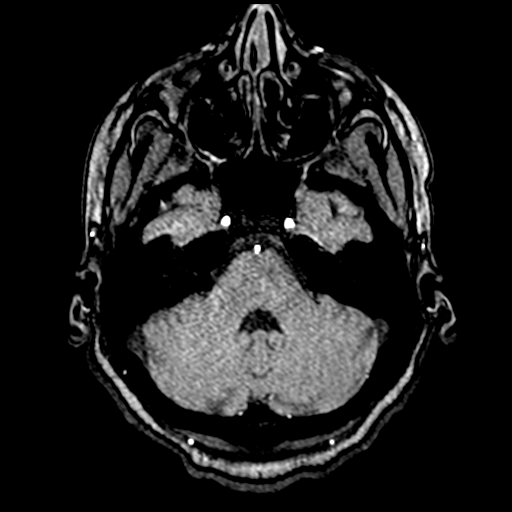
[im 77/172]
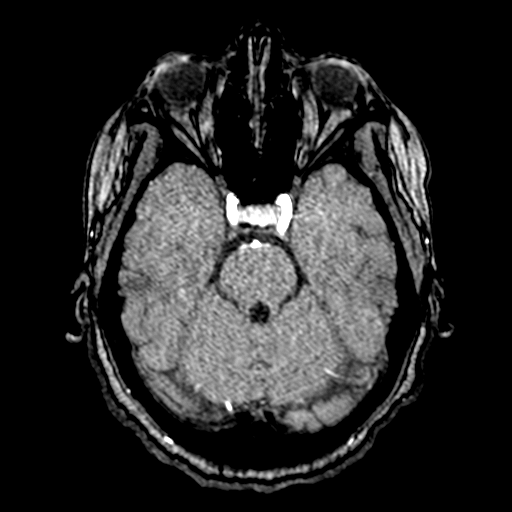
[im 88/172]
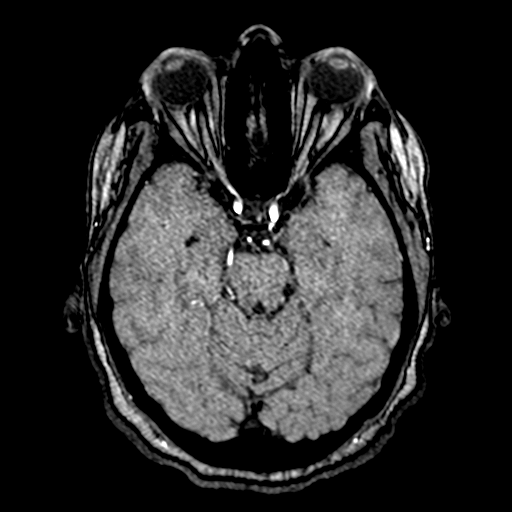
[im 99/172]
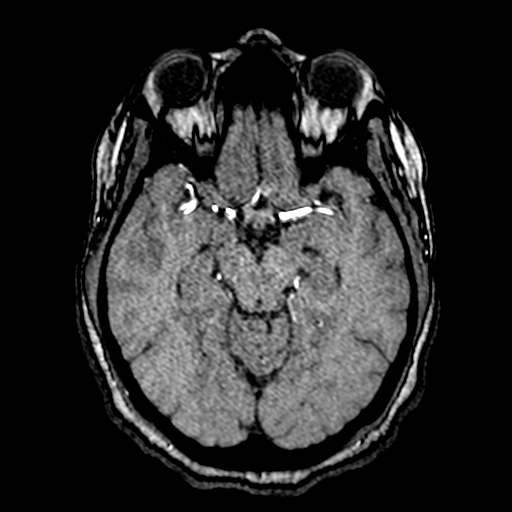
[im 121/172]
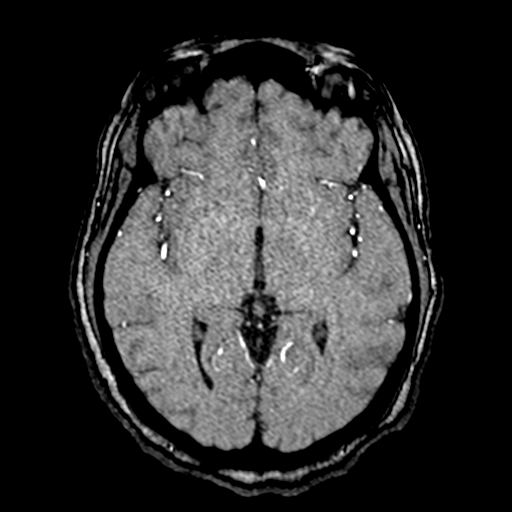
[im 142/172]
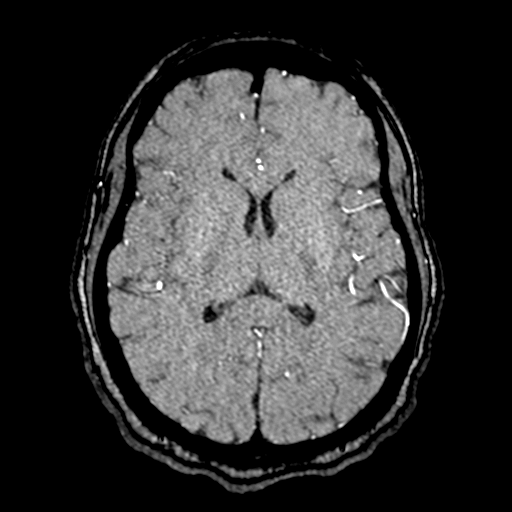
[im 146/172]
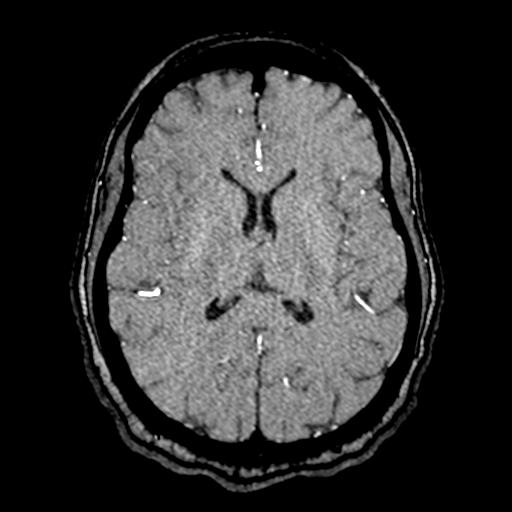
[im 164/172]
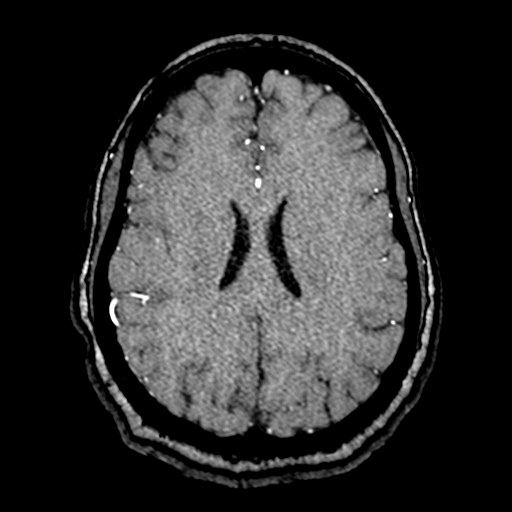

[19 of 48 positions shown; findings below may reference images not displayed]

FINDINGS: Anterior circulation: Visualized distal cervical segments of the
internal carotid arteries are widely patent with antegrade flow.
Petrous, cavernous, and supraclinoid segments patent without
stenosis or other abnormality. Origins of the ophthalmic arteries
patent and normal. A1 segments patent bilaterally. Normal anterior
communicating artery complex. Anterior cerebral arteries patent to
their distal aspects without stenosis. No M1 stenosis or occlusion.
Normal MCA bifurcations. Distal MCA branches perfused and symmetric.

Posterior circulation: Both vertebral arteries widely patent to the
vertebrobasilar junction. Both PICA origins patent and normal.
Basilar widely patent to its distal aspect without stenosis.
Superior cerebellar arteries patent bilaterally. Both PCA supplied
via the basilar as well as small bilateral posterior communicating
arteries. Both PCAs well perfused to their distal aspects without
stenosis.

Anatomic variants: None significant.

Other: No intracranial aneurysm or other vascular abnormality.
IMPRESSION: Normal intracranial MRA.

## 2021-07-27 IMAGING — MR MR HEAD WO/W CM
12 of 14 series · 40 of 48 positions shown · IV contrast (gadavist)
Comparison: None available.

CLINICAL DATA: Initial evaluation for persistent headache and
blurry vision, right-sided numbness and tingling. 2 weeks postpartum
with preeclampsia.

EXAM:
MRI HEAD WITHOUT AND WITH CONTRAST
TECHNIQUE: Multiplanar, multiecho pulse sequences of the brain and surrounding
structures were obtained without and with intravenous contrast.
CONTRAST:  8.5mL GADAVIST GADOBUTROL 1 MMOL/ML IV SOLN

[Series 5: DWI · axial · 3.0mm · 0.88mm/px · z∈[-150,-8]mm · 9 of 100 slices shown (1 of 4)]
[im 1/100]
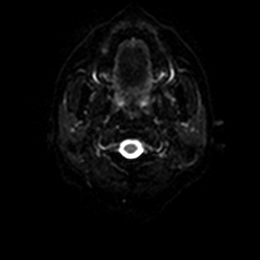
[im 13/100]
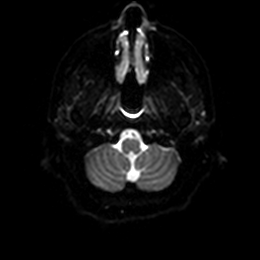
[im 25/100]
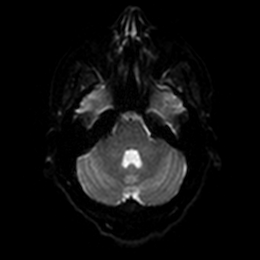
[im 38/100]
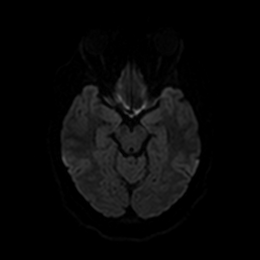
[im 50/100]
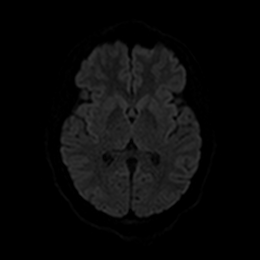
[im 62/100]
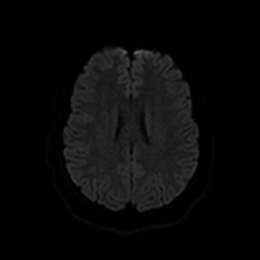
[im 75/100]
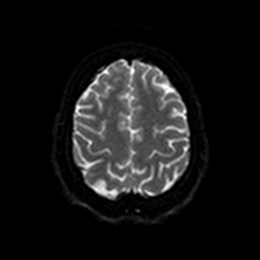
[im 87/100]
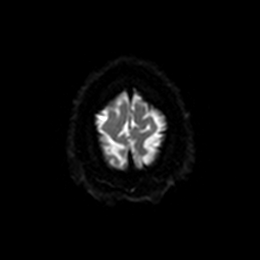
[im 100/100]
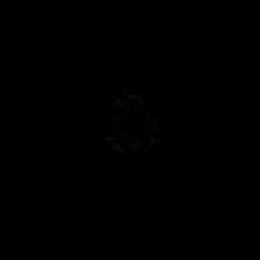

[Series 6: DWI · axial · 3.0mm · 0.88mm/px · z∈[-150,-8]mm · 4 of 50 slices shown (2 of 4)]
[im 1/50]
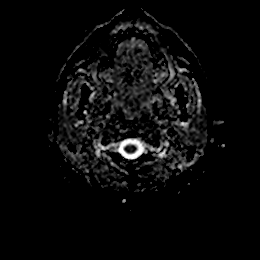
[im 17/50]
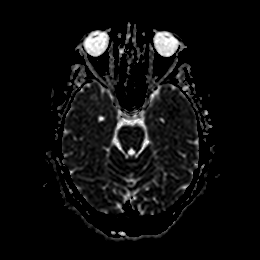
[im 33/50]
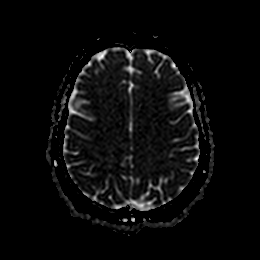
[im 50/50]
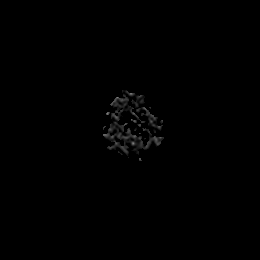

[Series 7: DWI · coronal · 4.0mm · 0.88mm/px · 5 of 64 slices shown (3 of 4)]
[im 1/64]
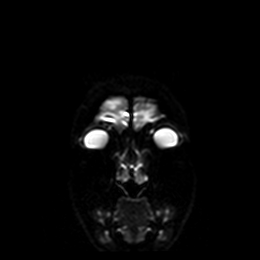
[im 16/64]
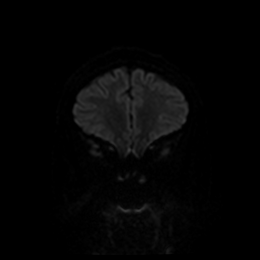
[im 32/64]
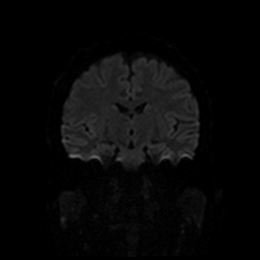
[im 48/64]
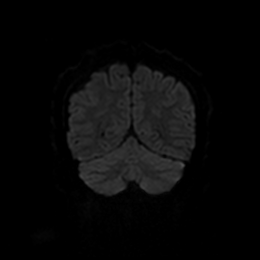
[im 64/64]
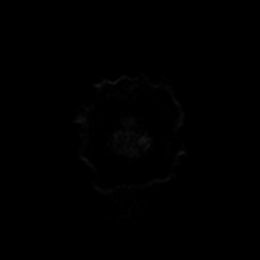

[Series 8: DWI · coronal · 4.0mm · 0.88mm/px · 2 of 32 slices shown (4 of 4)]
[im 1/32]
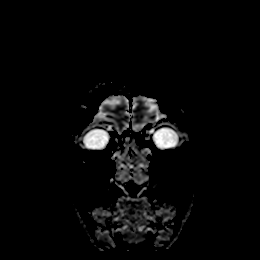
[im 32/32]
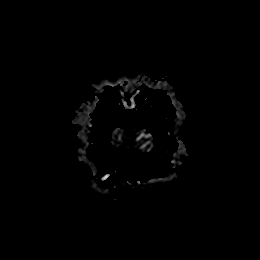

[Series 9: T1 · sagittal · 5.0mm · 0.75mm/px · 2 of 23 slices shown]
[im 1/23]
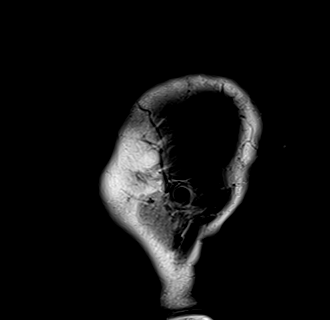
[im 23/23]
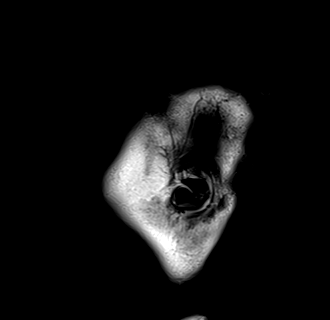

[Series 10: T2 · axial · 5.0mm · 0.72mm/px · z∈[-158,-7]mm · 2 of 27 slices shown (1 of 2)]
[im 1/27]
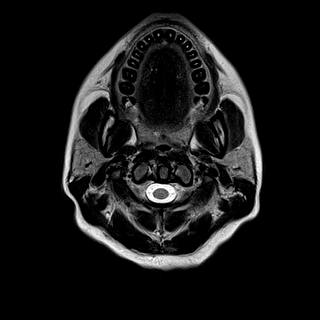
[im 27/27]
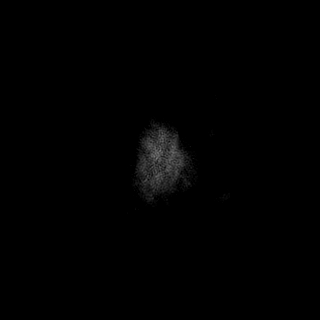

[Series 11: FLAIR · axial · 5.0mm · 0.45mm/px · z∈[-158,-7]mm · 2 of 27 slices shown]
[im 1/27]
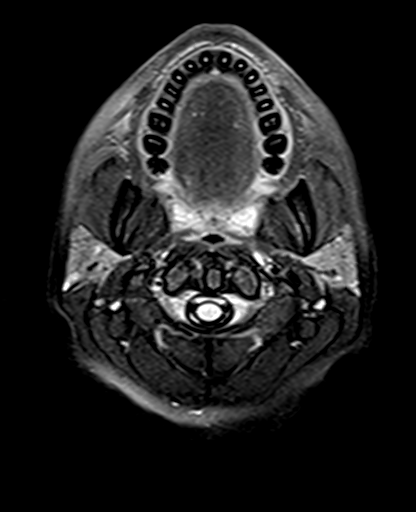
[im 27/27]
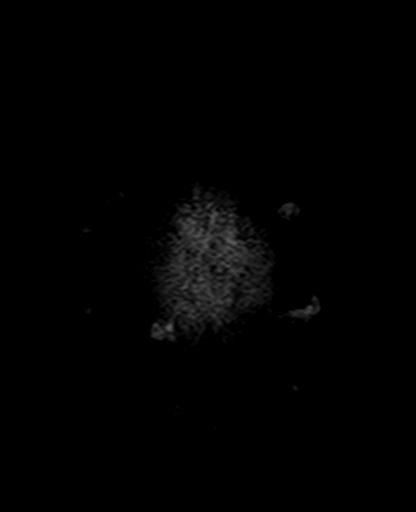

[Series 13: pha_images · axial · 3.0mm · 0.90mm/px · z∈[-166,-3]mm · 4 of 57 slices shown]
[im 1/57]
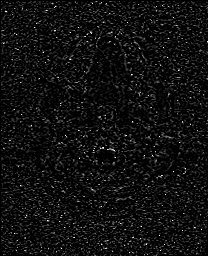
[im 19/57]
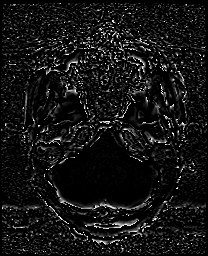
[im 38/57]
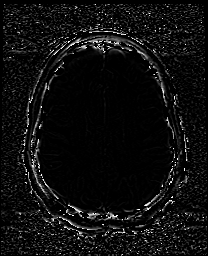
[im 57/57]
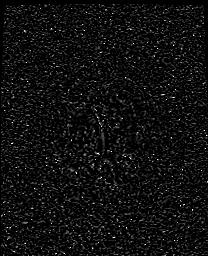

[Series 14: swi_images · axial · 3.0mm · 0.90mm/px · z∈[-166,+6]mm · 4 of 60 slices shown]
[im 1/60]
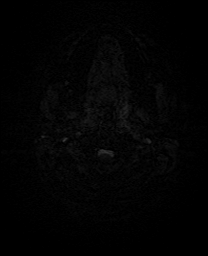
[im 20/60]
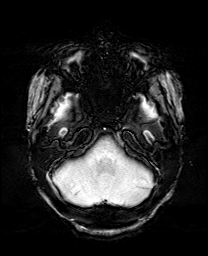
[im 40/60]
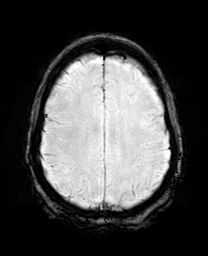
[im 60/60]
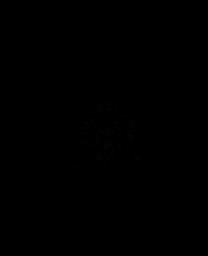

[Series 17: T2 · coronal · 5.0mm · 0.34mm/px · 2 of 29 slices shown (2 of 2)]
[im 1/29]
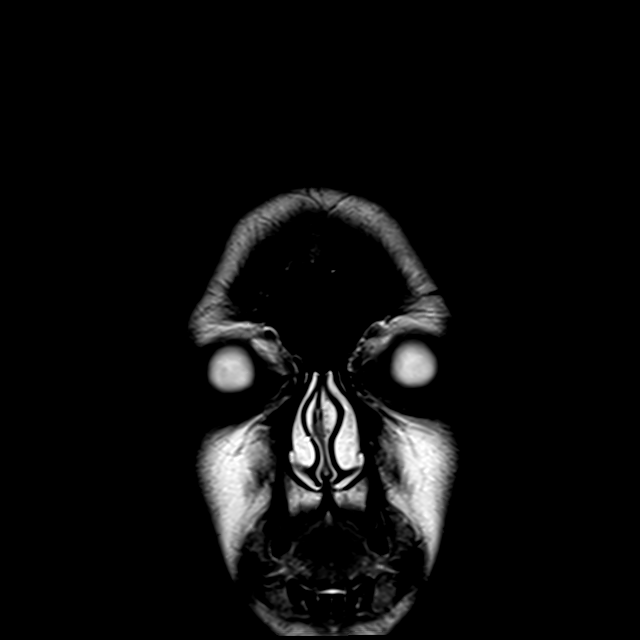
[im 29/29]
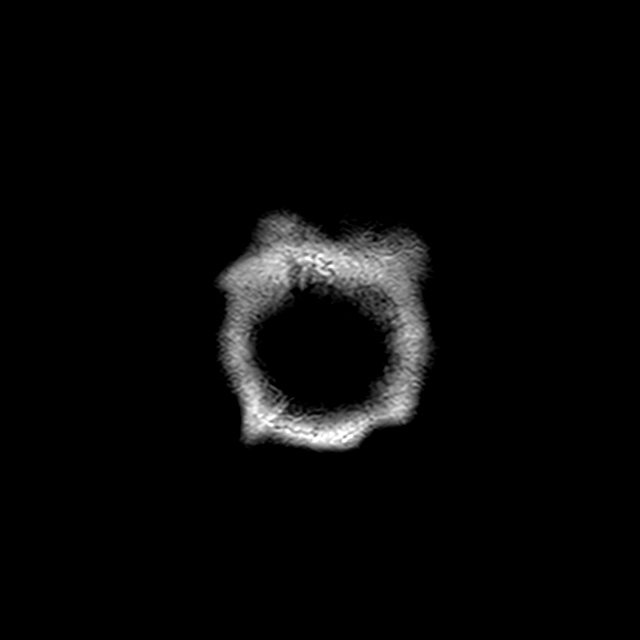

[Series 19: T1 post-contrast · coronal · 5.0mm · 0.34mm/px · 2 of 28 slices shown (1 of 2)]
[im 1/28]
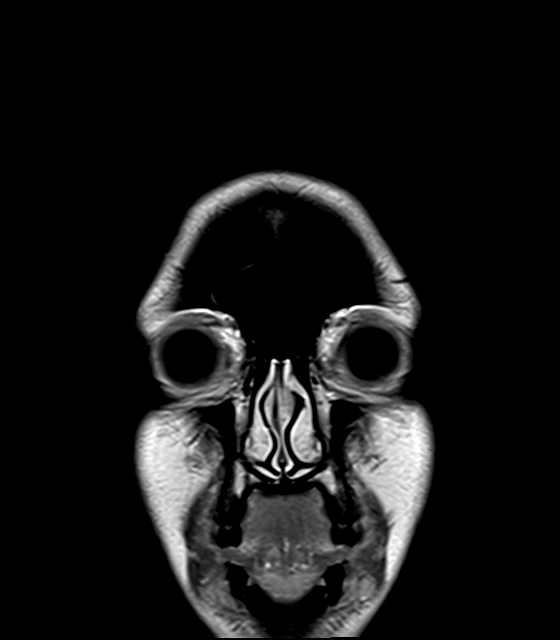
[im 28/28]
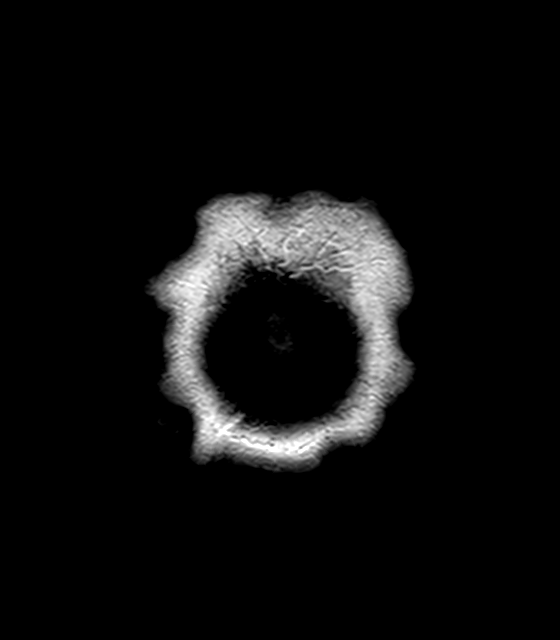

[Series 20: T1 post-contrast · sagittal · 5.0mm · 0.75mm/px · 2 of 23 slices shown (2 of 2)]
[im 1/23]
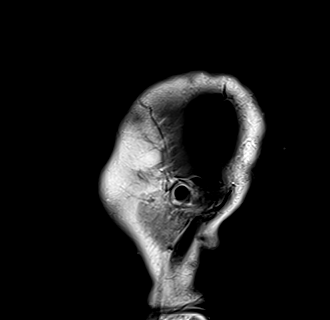
[im 23/23]
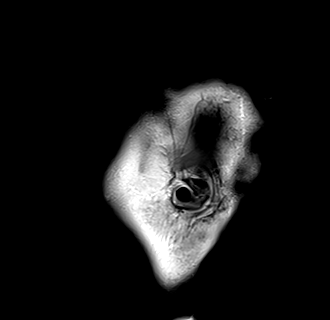

[40 of 48 positions shown; findings below may reference images not displayed]

FINDINGS: Brain: Cerebral volume within normal limits for patient age. No
focal parenchymal signal abnormality identified.

No abnormal foci of restricted diffusion to suggest acute or
subacute ischemia. Gray-white matter differentiation well
maintained. No encephalomalacia to suggest chronic infarction. No
foci of susceptibility artifact to suggest acute or chronic
intracranial hemorrhage.

No mass lesion, midline shift or mass effect. No hydrocephalus. No
extra-axial fluid collection.

Pituitary gland and suprasellar region are normal. Midline
structures intact and normal.

No abnormal enhancement.

Vascular: Major intracranial vascular flow voids well maintained.

Skull and upper cervical spine: Craniocervical junction normal.
Visualized upper cervical spine within normal limits. Bone marrow
signal intensity normal. No scalp soft tissue abnormality.

Sinuses/Orbits: Globes and orbital soft tissues within normal
limits.

Mild scattered mucosal thickening noted within the ethmoidal air
cells. Paranasal sinuses are otherwise clear. Trace right mastoid
effusion noted, of doubtful significance. Inner ear structures
grossly normal.

Other: None.
IMPRESSION: Normal brain MRI. No acute intracranial abnormality identified.

## 2021-07-27 IMAGING — MR MR MRV HEAD WO/W CM
5 of 7 series · 36 of 48 positions shown · IV contrast (gadavist)
Comparison: No pertinent prior exam.

CLINICAL DATA: Initial evaluation for persistent headache and
blurry vision, right-sided numbness and tingling. 2 weeks postpartum
with preeclampsia.

EXAM:
MR VENOGRAM HEAD WITHOUT AND WITH CONTRAST
TECHNIQUE: Angiographic images of the intracranial venous structures were
acquired using MRV technique without and with intravenous contrast.
CONTRAST:  8.5mL GADAVIST GADOBUTROL 1 MMOL/ML IV SOLN

[Series 5: tof_fl2d_paracor · coronal · 2.0mm · 0.98mm/px · 6 of 134 slices shown]
[im 1/134]
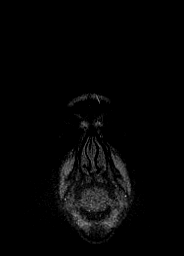
[im 27/134]
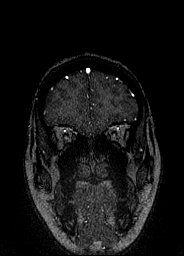
[im 54/134]
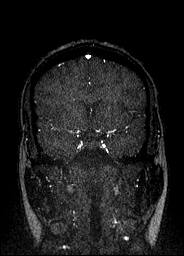
[im 80/134]
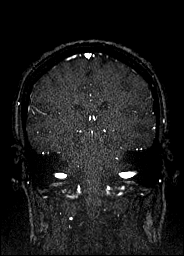
[im 107/134]
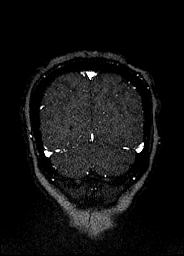
[im 134/134]
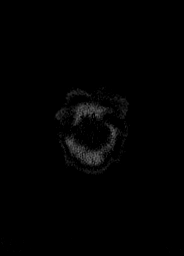

[Series 9: venous inhance coronal · coronal · portal-venous · 0.9mm · 0.57mm/px · 8 of 160 slices shown]
[im 1/160]
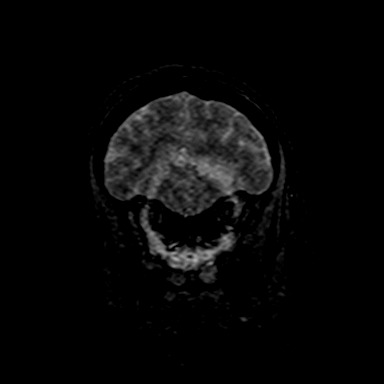
[im 23/160]
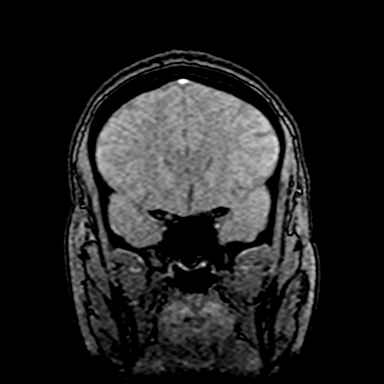
[im 46/160]
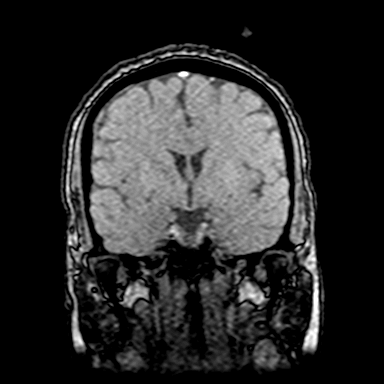
[im 69/160]
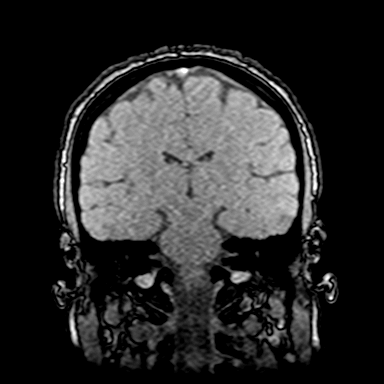
[im 91/160]
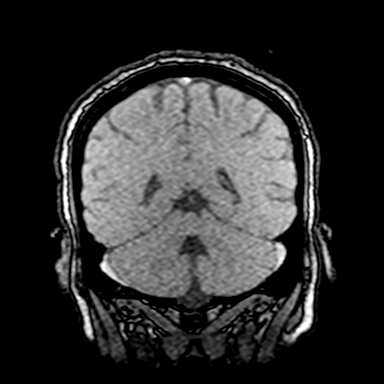
[im 114/160]
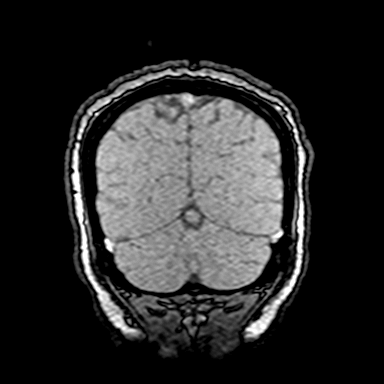
[im 137/160]
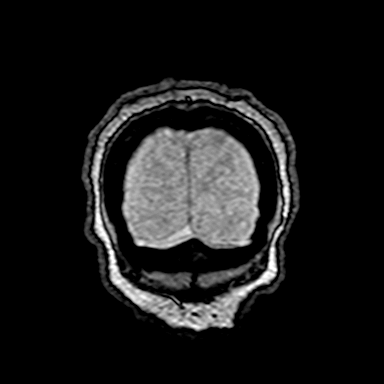
[im 160/160]
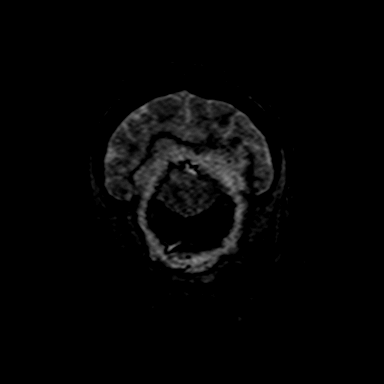

[Series 10: venous inhance coronal_msum · coronal · portal-venous · 0.9mm · 0.57mm/px · 8 of 158 slices shown]
[im 1/158]
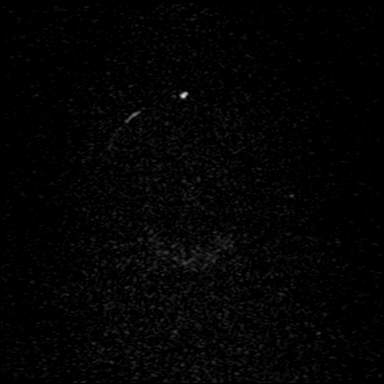
[im 23/158]
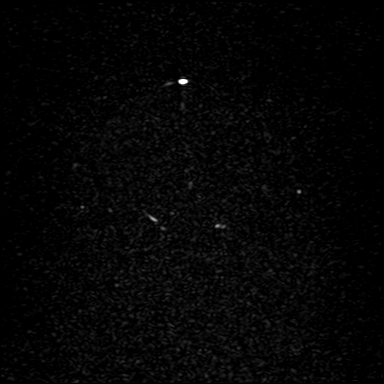
[im 45/158]
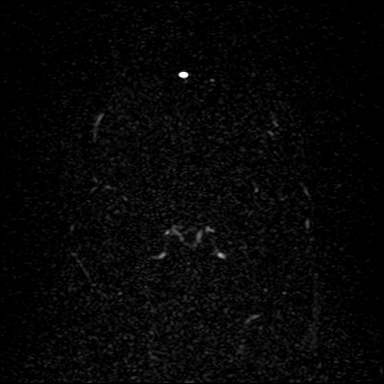
[im 68/158]
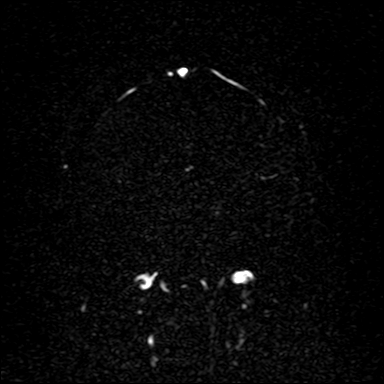
[im 90/158]
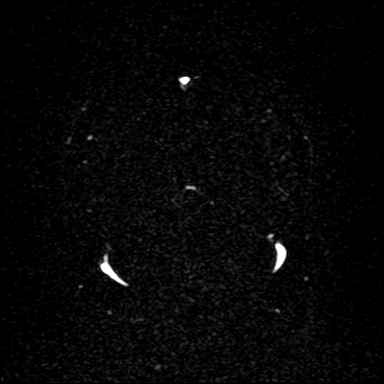
[im 113/158]
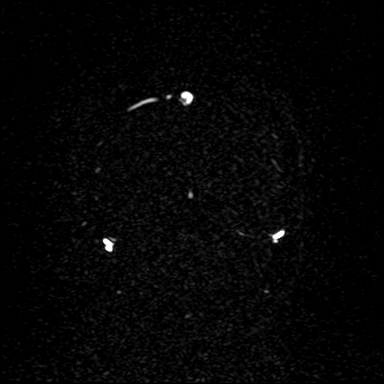
[im 135/158]
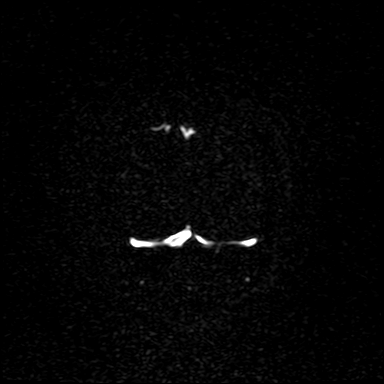
[im 158/158]
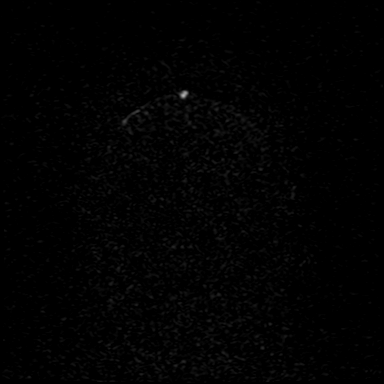

[Series 13: t1_fl3d_sag_p2_iso · sagittal · 1.0mm · 0.98mm/px · 8 of 192 slices shown]
[im 1/192]
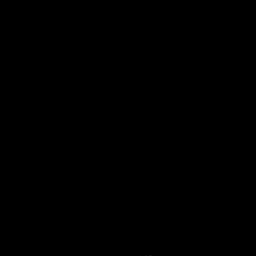
[im 22/192]
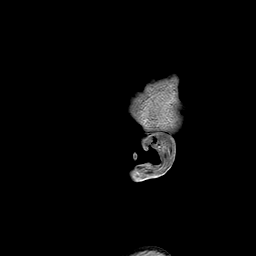
[im 64/192]
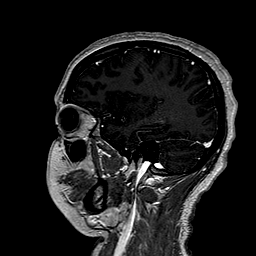
[im 85/192]
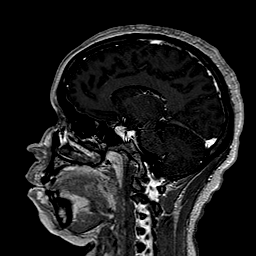
[im 107/192]
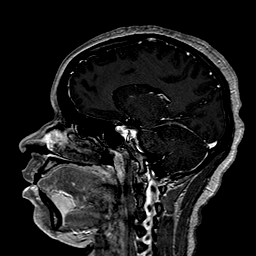
[im 128/192]
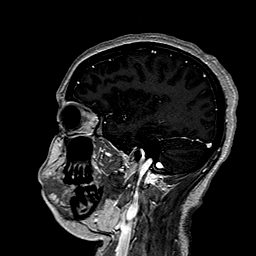
[im 170/192]
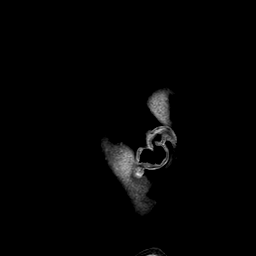
[im 192/192]
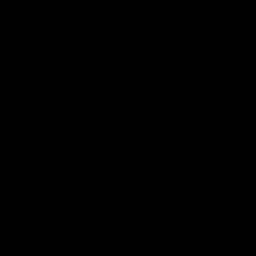

[Series 14: t1_fl3d_sag_p2_iso_mpr_ axial · axial · 2.0mm · 0.45mm/px · z∈[-203,+24]mm · 6 of 120 slices shown]
[im 1/120]
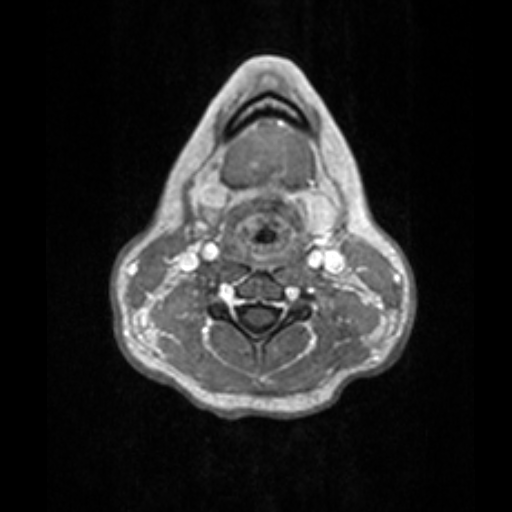
[im 24/120]
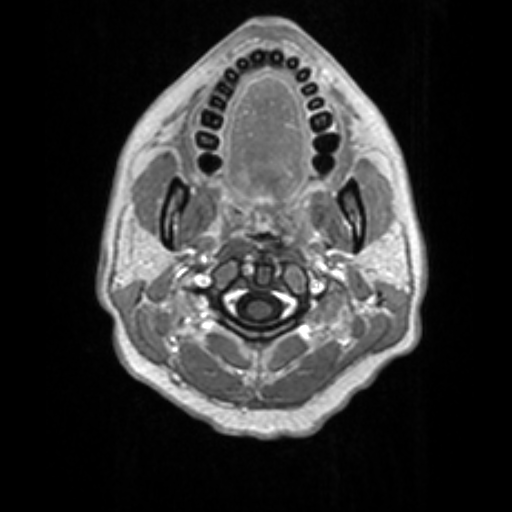
[im 48/120]
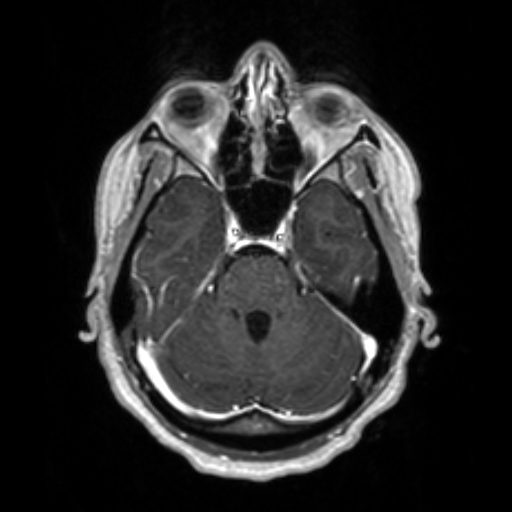
[im 72/120]
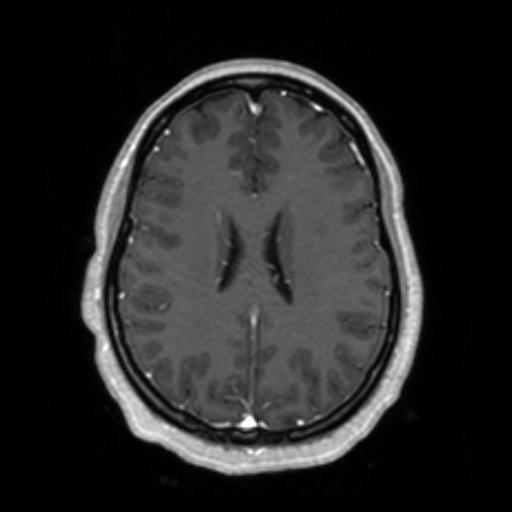
[im 96/120]
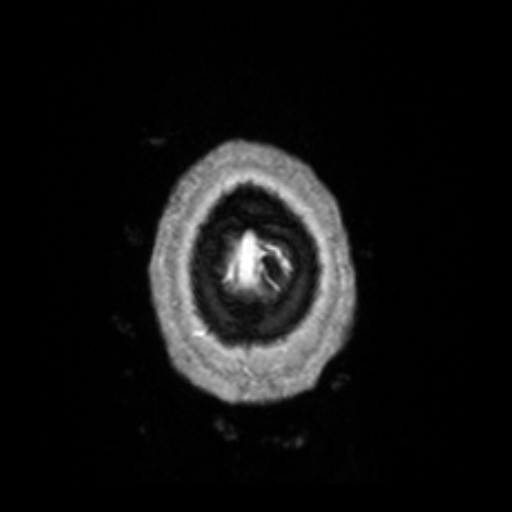
[im 120/120]
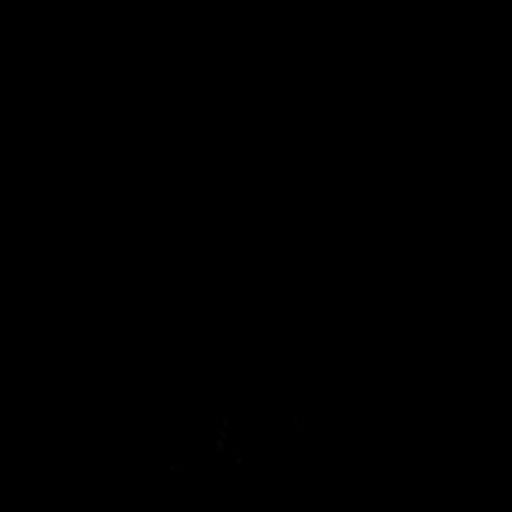

[36 of 48 positions shown; findings below may reference images not displayed]

FINDINGS: Normal flow related signal and enhancement seen throughout the
superior sagittal sinus to the torcula. Torcula itself is patent.
Transverse and sigmoid sinuses are widely patent as are the
visualized internal jugular veins. Straight sinus, vein of [place]A,
internal cerebral veins, and basal veins of K[place]A are patent. No
abnormality about the cavernous sinus. Superior orbital veins
symmetric. No evidence for dural sinus thrombosis. No appreciable
cortical venous thrombosis. No dural venous sinus stenosis.
IMPRESSION: Normal intracranial MRV. No evidence for dural venous sinus
thrombosis or other abnormality.

## 2021-07-27 IMAGING — MR MR MRA NECK WO/W CM
3 series · 42 of 48 positions shown · IV contrast (gadavist)
Comparison: None available.

CLINICAL DATA: Initial evaluation for persistent headache and
blurry vision, right-sided numbness and tingling. 2 weeks postpartum
with preeclampsia.

EXAM:
MRA NECK WITHOUT AND WITH CONTRAST
TECHNIQUE: Multiplanar and multiecho pulse sequences of the neck were obtained
without and with intravenous contrast. Angiographic images of the
neck were obtained using MRA technique without and with intravenous
contrast.
CONTRAST:  8.5mL GADAVIST GADOBUTROL 1 MMOL/ML IV SOLN

[Series 7: tof_fl3d_tra_iso · axial · 0.6mm · 0.52mm/px · z∈[-196,-118]mm · 22 of 133 slices shown]
[im 1/133]
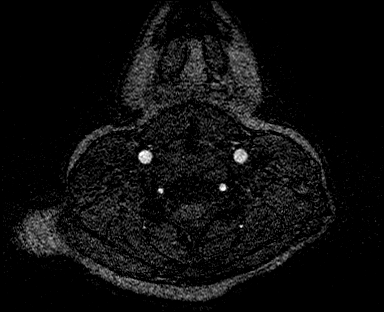
[im 7/133]
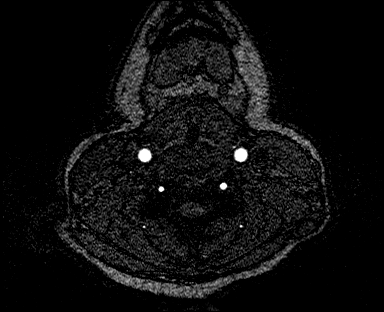
[im 13/133]
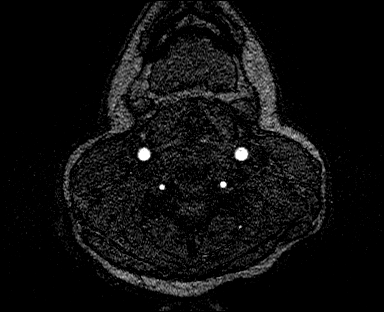
[im 19/133]
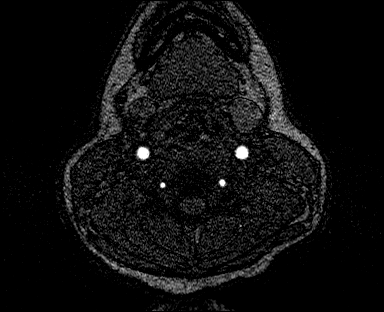
[im 26/133]
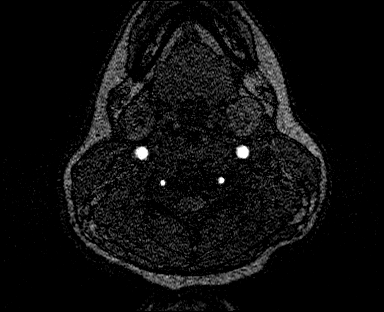
[im 32/133]
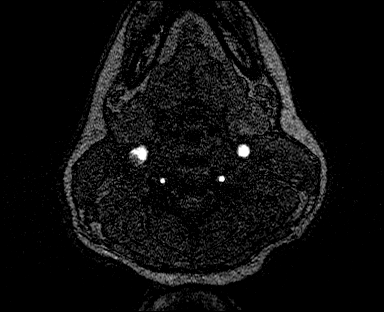
[im 38/133]
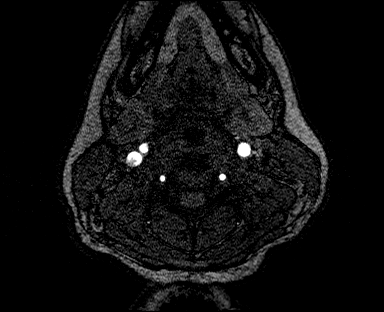
[im 45/133]
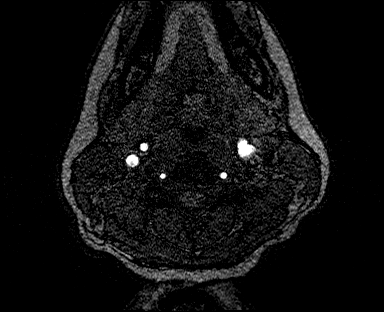
[im 51/133]
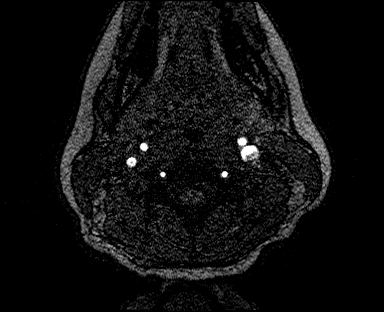
[im 57/133]
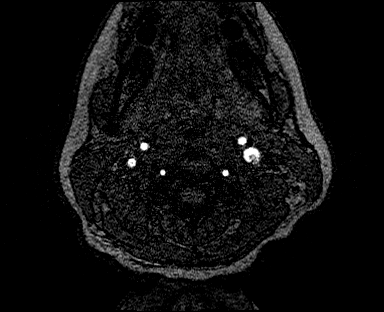
[im 63/133]
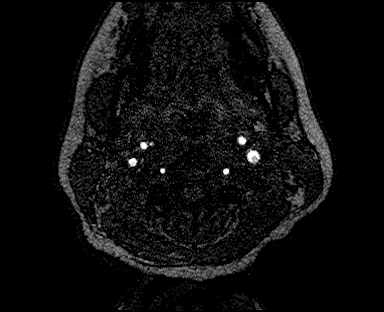
[im 70/133]
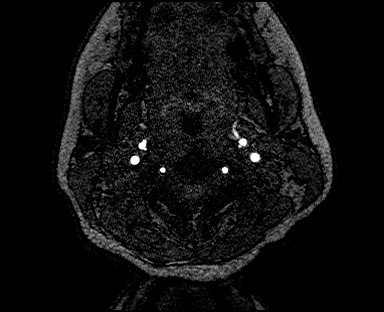
[im 76/133]
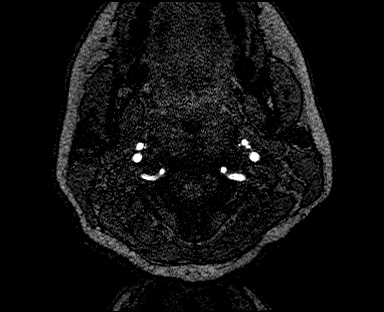
[im 82/133]
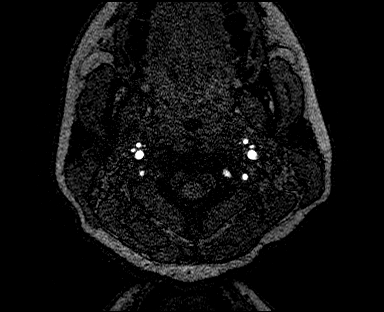
[im 89/133]
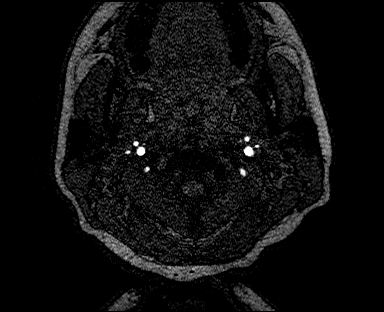
[im 95/133]
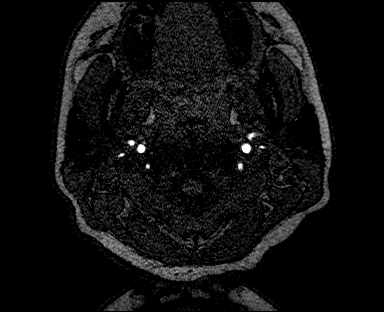
[im 101/133]
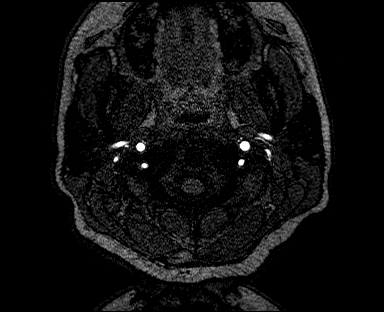
[im 107/133]
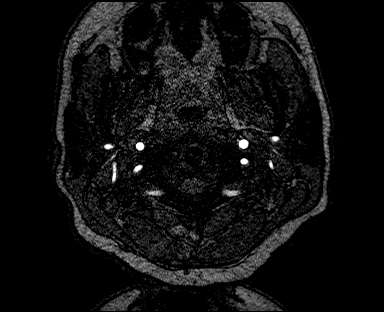
[im 114/133]
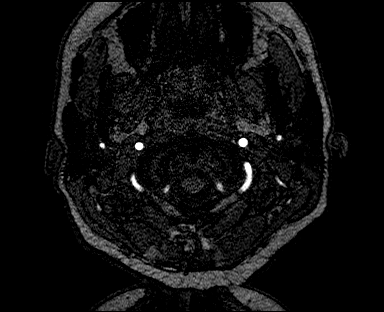
[im 120/133]
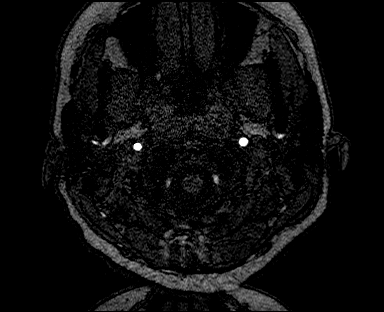
[im 126/133]
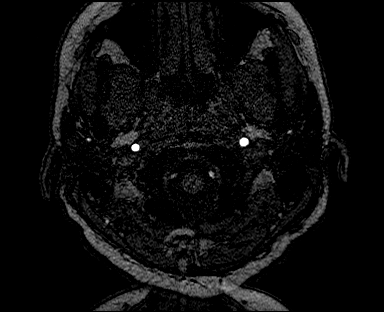
[im 133/133]
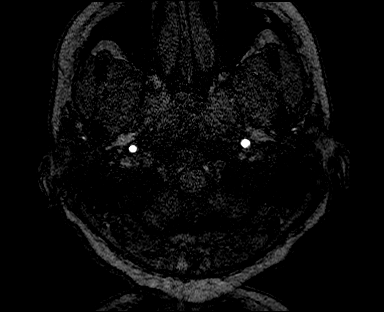

[Series 10: angio_fl3d_cor_pre_ttc=3.0s · coronal · 0.9mm · 0.85mm/px · 11 of 80 slices shown]
[im 1/80]
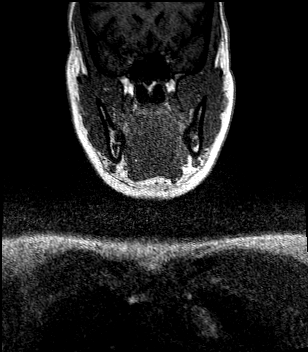
[im 7/80]
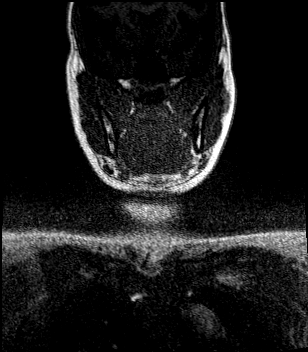
[im 14/80]
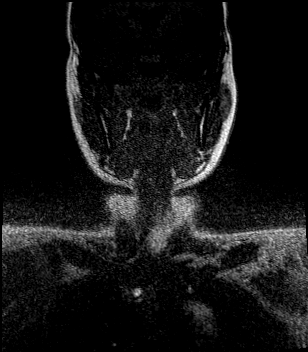
[im 20/80]
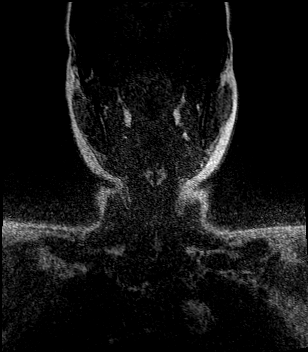
[im 27/80]
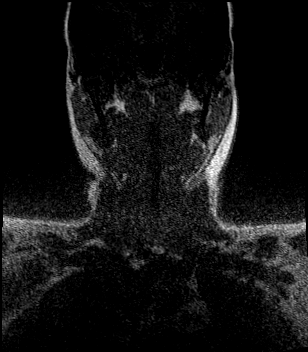
[im 33/80]
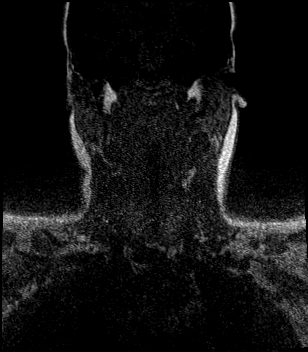
[im 40/80]
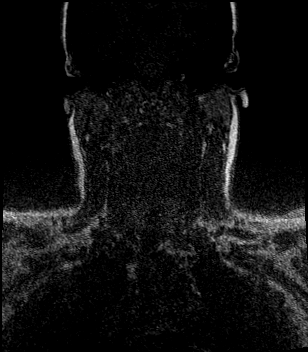
[im 47/80]
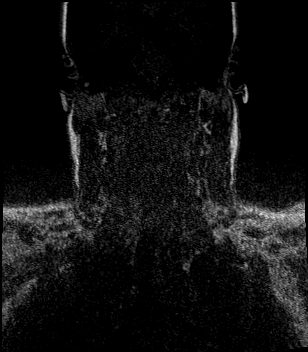
[im 53/80]
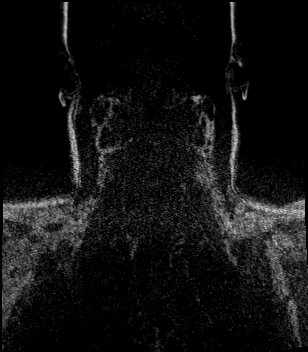
[im 66/80]
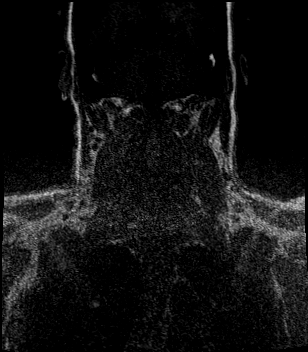
[im 80/80]
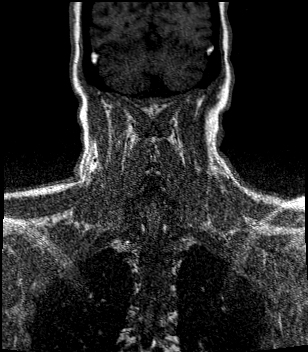

[Series 16: angio_fl3d_cor_post_ttc=3.0s · coronal · 0.9mm · 0.85mm/px · 9 of 80 slices shown]
[im 1/80]
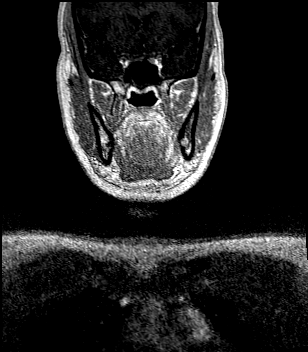
[im 14/80]
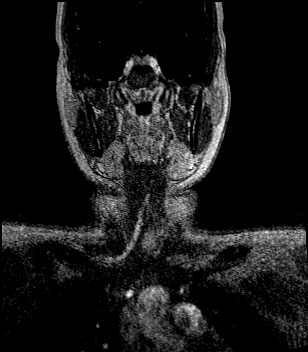
[im 27/80]
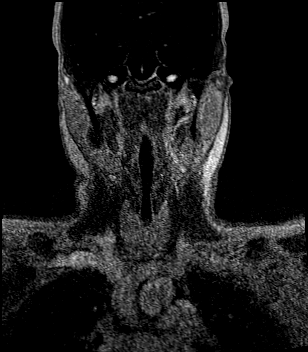
[im 33/80]
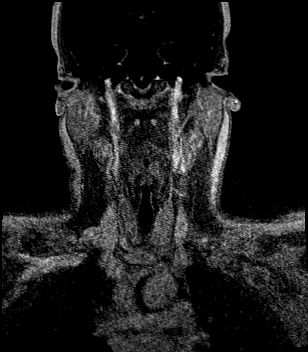
[im 40/80]
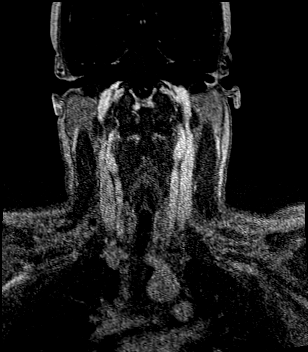
[im 47/80]
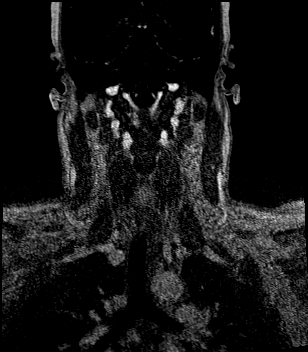
[im 53/80]
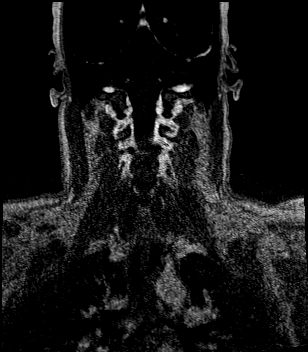
[im 66/80]
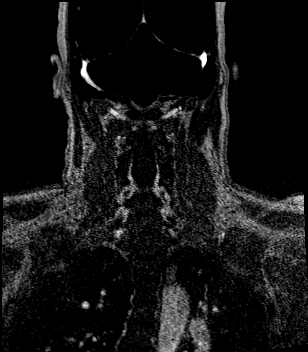
[im 80/80]
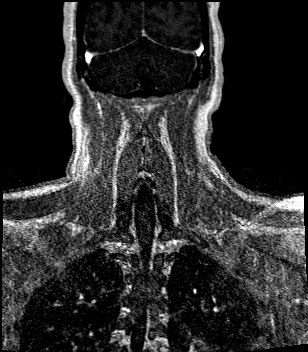

[42 of 48 positions shown; findings below may reference images not displayed]

FINDINGS: Examination somewhat technically limited by timing of the contrast
bolus.

AORTIC ARCH: Visualized aortic arch normal caliber with normal
branch pattern. No stenosis or other abnormality seen about the
origin the great vessels.

RIGHT CAROTID SYSTEM: Right common and internal carotid arteries
patent without stenosis, evidence for dissection, or occlusion.

LEFT CAROTID SYSTEM: Left common and internal carotid arteries
patent without stenosis, evidence for dissection or occlusion.

VERTEBRAL ARTERIES: Both vertebral arteries arise from the
subclavian arteries. Left vertebral artery slightly dominant.
Origins of the vertebral arteries not well assessed on this somewhat
technically limited exam. Visualized portions of the vertebral
arteries patent without stenosis, evidence for dissection or
occlusion.

Other: None.
IMPRESSION: Normal MRA of the neck.

## 2021-07-27 MED ORDER — SODIUM CHLORIDE 0.9 % IV SOLN
25.0000 mg | Freq: Once | INTRAVENOUS | Status: AC
  Administered 2021-07-27: 25 mg via INTRAVENOUS
  Filled 2021-07-27: qty 1

## 2021-07-27 MED ORDER — KETOROLAC TROMETHAMINE 30 MG/ML IJ SOLN
15.0000 mg | Freq: Once | INTRAMUSCULAR | Status: AC
Start: 2021-07-27 — End: 2021-07-27
  Administered 2021-07-27: 15 mg via INTRAVENOUS
  Filled 2021-07-27: qty 1

## 2021-07-27 MED ORDER — POTASSIUM CHLORIDE CRYS ER 20 MEQ PO TBCR
40.0000 meq | EXTENDED_RELEASE_TABLET | Freq: Once | ORAL | Status: AC
  Administered 2021-07-27: 40 meq via ORAL
  Filled 2021-07-27: qty 2

## 2021-07-27 MED ORDER — GADOBUTROL 1 MMOL/ML IV SOLN
8.5000 mL | Freq: Once | INTRAVENOUS | Status: AC | PRN
  Administered 2021-07-27: 8.5 mL via INTRAVENOUS

## 2021-07-27 MED ORDER — NIFEDIPINE ER OSMOTIC RELEASE 30 MG PO TB24: 60.0000 mg | ORAL_TABLET | Freq: Once | ORAL | Status: DC

## 2021-07-27 MED ORDER — NIFEDIPINE ER OSMOTIC RELEASE 30 MG PO TB24
30.0000 mg | ORAL_TABLET | Freq: Once | ORAL | Status: AC
  Administered 2021-07-28: 30 mg via ORAL
  Filled 2021-07-27: qty 1

## 2021-07-27 NOTE — MAU Provider Note (Addendum)
History     CSN: [id number]  Arrival date and time: 07/27/21 1753   Chief Complaint  Patient presents with   visual changes   Numbness   Julia Phillips is a 20 yo female G1P0101, who is 2 weeks postoperative from a primary C-section for preeclampsia with severe features/FGR with abnormal UAD, presenting for evaluation of persistent headache, blurred vision/floaters, right arm numbness/tingling, and new right leg numbness/tingling. She is s/p Mag after delivery.   Headache: Started 2 days ago while visiting her daughter in the NICU.  Currently occipital and throbbing, 7-03/2009 in the pain scale.  Tried Tylenol earlier without any relief.  Occasionally will have frontal headache but that seems to come and go.  Her headache got a little bit better with a cocktail in the MAU yesterday but returned shortly. Not worse with position changes.   Blurred vision/floaters: Does not wear contacts or glasses.  Feels like her vision has generally been blurred for the past 2 days.  She notices "dots" in her vision only peripherally bilaterally.  Denies any eye pain, conjunctival erythema, photophobia, or phonophobia  Right arm numbness/tingling: Present for the past 2 days as well.  Endorses it feels more like a "heaviness" of her entire arm from her shoulder into her fingertips.  It has not worsened since yesterday.  Able to move her arm as normal without weakness, however does feel like it is more difficult to text on her phone.  Tingling occasionally in her hand.  No specific provocative factors that she is aware of.  She has not done any heavy lifting, has not held her infant who has been intubated in the NICU.  Right leg numbness/tingling: This started earlier today, present mainly in her right lower leg with the same "heaviness "feeling that will go into her foot.  Normal gait.  Denies any extremity weakness, bowel/bladder incontinence/retention, saddle anesthesia, or severe low back pain.  She is on  Procardia and labetalol for her postpartum hypertension, instructed yesterday to discontinue labetalol.  She has not taken her Procardia since yesterday, she thought she was prescribed a new dose and had not been able to pick up in the pharmacy (they would not allow it due to recently being prescribed procardia already).    Past Medical History:  Diagnosis Date   Depression    Pregnancy induced hypertension    Scoliosis     Past Surgical History:  Procedure Laterality Date   CESAREAN SECTION      Family History  Problem Relation Age of Onset   Diabetes Mother    Diabetes Paternal Grandmother     Social History   Tobacco Use   Smoking status: Former    Types: E-cigarettes    Start date: 01/30/2021    Passive exposure: Yes   Smokeless tobacco: Never  Vaping Use   Vaping Use: Former  Substance Use Topics   Alcohol use: Not Currently    Comment: rare   Drug use: Not Currently    Types: Marijuana    Comment: last use today 24 Jun 2021    Allergies: No Known Allergies  Medications Prior to Admission  Medication Sig Dispense Refill Last Dose   Blood Pressure Monitoring (BLOOD PRESSURE KIT) DEVI 1 Device by Does not apply route once a week. (Patient not taking: Reported on 06/24/2021) 1 each 0    fluticasone (FLONASE) 50 MCG/ACT nasal spray Place 1 spray into both nostrils daily. 18.2 mL 2    NIFEdipine (PROCARDIA XL) 60 MG 24  hr tablet Take 1 tablet (60 mg total) by mouth 2 (two) times daily. 60 tablet 0    Prenatal 27-1 MG TABS Take 1 tablet by mouth daily.       Review of Systems  Constitutional:  Negative for fatigue and fever.  HENT:  Negative for congestion and sore throat.   Eyes:  Positive for visual disturbance. Negative for photophobia and redness.  Respiratory:  Negative for cough and shortness of breath.   Cardiovascular:  Negative for chest pain.  Gastrointestinal:  Negative for abdominal pain, nausea and vomiting.  Genitourinary:  Negative for decreased  urine volume, difficulty urinating, frequency and pelvic pain.  Musculoskeletal:  Negative for back pain, neck pain and neck stiffness.  Skin:  Negative for rash and wound.  Neurological:  Positive for numbness and headaches. Negative for dizziness, seizures, speech difficulty, weakness and light-headedness.  Physical Exam   Blood pressure (!) 146/100, pulse 79, temperature 99.1 F (37.3 C), temperature source Oral, resp. rate 16, SpO2 99 %, currently breastfeeding.  Physical Exam Constitutional:      General: She is not in acute distress.    Appearance: Normal appearance. She is not ill-appearing or toxic-appearing.  HENT:     Head: Normocephalic and atraumatic.     Right Ear: External ear normal.     Left Ear: External ear normal.     Nose: Nose normal.     Mouth/Throat:     Mouth: Mucous membranes are moist.  Eyes:     Extraocular Movements: Extraocular movements intact.     Conjunctiva/sclera: Conjunctivae normal.     Pupils: Pupils are equal, round, and reactive to light.  Cardiovascular:     Rate and Rhythm: Normal rate and regular rhythm.     Heart sounds: No murmur heard. Pulmonary:     Effort: Pulmonary effort is normal.     Breath sounds: Normal breath sounds.  Abdominal:     Palpations: Abdomen is soft.     Comments: Lower abdominal horizontal incision appears well healed without any swelling, drainage, or erythema.   Musculoskeletal:     Cervical back: Normal range of motion and neck supple. No rigidity.  Skin:    General: Skin is warm and dry.     Capillary Refill: Capillary refill takes less than 2 seconds.     Findings: No bruising or rash.  Neurological:     Mental Status: She is alert.     Comments: CN II-XII intact, with the exception that patient subjectively endorses the left side of her face has decreased sensation in comparison to the right.  Able to move all extremities spontaneously and equally.  5/5 upper and lower extremity strength bilaterally.   Endorses decreased sensation of entire right arm to palpation in comparison to the left, can feel cool and sharp still.  Additionally endorses decreased sensation of her right thigh in comparison to left, normal sensation of lower leg/feet elsewhere.  Rapid hand movement intact.  Normal gait and can balance without difficulty.  2/4 patellar reflexes bilaterally.    MAU Course   MDM CBC, CMP >> Unremarkable w/ exception of hypokalemia and elevated platelets similar to previous. Cr WNL. Ordered IV toradol and phenergan for HA relief (boyfriend at bedside to drive home)  Spoke with Dr. Leonel Ramsay, neurology, who recommended obtaining an MRI, MR angio, and MRV of the brain/neck.  Orders placed. Plan to monitor BP while awaiting imaging. May improve with medications above.   Reassessed 2045: Headache improving after recently  receiving medications. "Heaviness" is unchanged. SBP now 130's. Dr. Elonda Husky made aware of presentation and will follow up after MRI results.   Assessment and Plan   Intractable occipital headache Extremity numbness  Blurred vision, floaters  Two weeks postpartum  History of severe pre-eclampsia, known postpartum hypertension   Differential broad--considering complex migraine, intracranial pathology with subjective neurologic complaints, peripheral neuropathy, sequela of pre-eclampsia.  Patient awaiting MRI. Sign out given to Gavin Pound, CNM at 2050. Disposition pending MRI results, if abnormal will re-discuss with Neurology. Patient will need to continue her procardia BID.    Patriciaann Clan 07/27/2021, 7:13 PM   Reassessment (1:09 AM)  -MRI returns normal/negative -Provider to bedside to discuss results. -Instructed to take medication ( Procardia) as prescribed and monitor blood pressure appropriately. -Precautions given. -Patient instructed to follow-up with primary office as scheduled. -Encouraged to call primary office or return to MAU if symptoms worsen or with  the onset of new symptoms. -Discharged to home in stable condition.  Maryann Conners MSN, CNM Advanced Practice Provider, Center for [employer]

## 2021-07-27 NOTE — MAU Note (Signed)
Was here yesterday for kind of the same things.  C/s on 11/25, at [facility], (was transferred there.)Blurring vision, sometimes black spots.  Numbness and tingling in her rt arm, this started 2 days ago,  today it has now started in her legs. (Even, steady gait when walking).  Yesterday BP was a bit elevated.  Was given medicine for it, did not start it, the pharmacist said she needed to take what she was already given.  Has had a HA all day, took Tylenol- no relief.

## 2021-07-29 ENCOUNTER — Encounter: Payer: Self-pay | Admitting: Neurology

## 2021-08-05 NOTE — Progress Notes (Signed)
Erroneous visit

## 2021-08-13 NOTE — Progress Notes (Deleted)
° ° °  Post Partum Visit Note  Julia Phillips is a 20 y.o. G25P0101 female who presents for a postpartum visit. She is 4 weeks postpartum following a primary cesarean section.  I have fully reviewed the prenatal and intrapartum course. The delivery was at 27.2 gestational weeks.  Anesthesia: spinal. Postpartum course has been ***. Baby is doing well***. Baby is feeding by {breast/bottle:69}. Bleeding {vag bleed:12292}. Bowel function is {normal:32111}. Bladder function is {normal:32111}. Patient {is/is not:9024} sexually active. Contraception method is {contraceptive method:5051}. Postpartum depression screening: {gen negative/positive:315881}.   The pregnancy intention screening data noted above was reviewed. Potential methods of contraception were discussed. The patient elected to proceed with No data recorded.    Health Maintenance Due  Topic Date Due   HPV VACCINES (3 - 2-dose series) 02/19/2011   PAP-Cervical Cytology Screening  Never done   PAP SMEAR-Modifier  Never done    {Common ambulatory SmartLinks:19316}  Review of Systems {ros; complete:30496}  Objective:  There were no vitals taken for this visit.   General:  {gen appearance:16600}   Breasts:  {desc; normal/abnormal/not indicated:14647}  Lungs: {lung exam:16931}  Heart:  {heart exam:5510}  Abdomen: {abdomen exam:16834}   Wound {Wound assessment:11097}  GU exam:  {desc; normal/abnormal/not indicated:14647}       Assessment:    There are no diagnoses linked to this encounter.  *** postpartum exam.   Plan:   Essential components of care per ACOG recommendations:  1.  Mood and well being: Patient with {gen negative/positive:315881} depression screening today. Reviewed local resources for support.  - Patient tobacco use? {tobacco use:25506}  - hx of drug use? {yes/no:25505}    2. Infant care and feeding:  -Patient currently breastmilk feeding? {yes/no:25502}  -Social determinants of health (SDOH) reviewed in  EPIC. No concerns***The following needs were identified***  3. Sexuality, contraception and birth spacing - Patient {DOES_DOES QPY:19509} want a pregnancy in the next year.  Desired family size is {NUMBER 1-10:22536} children.  - Reviewed forms of contraception in tiered fashion. Patient desired {PLAN CONTRACEPTION:313102} today.   - Discussed birth spacing of 18 months  4. Sleep and fatigue -Encouraged family/partner/community support of 4 hrs of uninterrupted sleep to help with mood and fatigue  5. Physical Recovery  - Discussed patients delivery and complications. She describes her labor as {description:25511} - Patient had a {CHL AMB DELIVERY:[phone]}. Patient had a {laceration:25518} laceration. Perineal healing reviewed. Patient expressed understanding - Patient has urinary incontinence? {yes/no:25515} - Patient {ACTION; IS/IS TOI:71245809} safe to resume physical and sexual activity  6.  Health Maintenance - HM due items addressed {Yes or If no, why not?:20788} - Last pap smear No results found for: DIAGPAP Pap smear {done:10129} at today's visit.  -Breast Cancer screening indicated? {indicated:25516}  7. Chronic Disease/Pregnancy Condition follow up: {Follow up:25499}  - PCP follow up  Isabell Jarvis, RN Center for [employer], [facility]

## 2021-08-14 ENCOUNTER — Other Ambulatory Visit: Payer: Self-pay

## 2021-08-14 ENCOUNTER — Ambulatory Visit: Payer: Medicaid Other

## 2021-10-07 ENCOUNTER — Ambulatory Visit: Payer: Medicaid Other | Admitting: Neurology

## 2021-10-29 ENCOUNTER — Telehealth: Payer: Self-pay | Admitting: Family Medicine

## 2021-10-29 NOTE — Telephone Encounter (Signed)
Health Department called in reference to this mothers baby, they stated the baby has just been discharged from [facility] a few weeks ago and has not been seen through the Newborn program yet. The mother has also not been rescheduled from her MB Postpartum she no showed in December.  ?

## 2021-10-29 NOTE — Telephone Encounter (Signed)
Call placed to pt. Spoke with pt. Pt states baby came up from hospital and would like to come to office for PP visit. Advised would be good to do and scheduled patient for 3/20. Pt agreeable to date and time of appt.  ?Johneric Mcfadden,RN  ?

## 2021-11-04 ENCOUNTER — Other Ambulatory Visit: Payer: Self-pay

## 2021-11-04 ENCOUNTER — Ambulatory Visit (INDEPENDENT_AMBULATORY_CARE_PROVIDER_SITE_OTHER): Payer: Medicaid Other | Admitting: Family Medicine

## 2021-11-04 ENCOUNTER — Encounter: Payer: Self-pay | Admitting: Family Medicine

## 2021-11-04 DIAGNOSIS — F53 Postpartum depression: Secondary | ICD-10-CM | POA: Diagnosis not present

## 2021-11-04 DIAGNOSIS — O1410 Severe pre-eclampsia, unspecified trimester: Secondary | ICD-10-CM | POA: Insufficient documentation

## 2021-11-04 NOTE — Progress Notes (Signed)
? ? ?Post Partum Visit Note ? ?Julia Phillips is a 21 y.o. G96P0101 female who presents for a postpartum visit. She is  3   months  postpartum following a primary cesarean section.  I have fully reviewed the prenatal and intrapartum course. The delivery was at 27 gestational weeks.  Anesthesia: spinal. Postpartum course has been complicated by NICU admission-- delivery at 27 weeks, discharged 4/8 and has NOT BEEN SEEN since discharge. Baby is doing well but has not been seen by provider in 6 weeks since discharge from [facility] NICU. Baby is feeding by both breast and bottle - Similac Neosure. Bleeding no bleeding. Bowel function is normal. Bladder function is normal. Patient is sexually active. Contraception method is Nexplanon. Postpartum depression screening: positive. ? ?DOB [birth year] @ [facility]  ? ?The pregnancy intention screening data noted above was reviewed. Potential methods of contraception were discussed. The patient elected to proceed with No data recorded. ? ? Edinburgh Postnatal Depression Scale - 11/04/21 1133   ? ?  ? Edinburgh Postnatal Depression Scale:  In the Past 7 Days  ? I have been able to laugh and see the funny side of things. 0   ? I have looked forward with enjoyment to things. 0   ? I have blamed myself unnecessarily when things went wrong. 2   ? I have been anxious or worried for no good reason. 2   ? I have felt scared or panicky for no good reason. 2   ? Things have been getting on top of me. 1   ? I have been so unhappy that I have had difficulty sleeping. 1   ? I have felt sad or miserable. 1   ? I have been so unhappy that I have been crying. 1   ? The thought of harming myself has occurred to me. 0   ? Edinburgh Postnatal Depression Scale Total 10   ? ?  ?  ? ?  ? ? ?Health Maintenance Due  ?Topic Date Due  ? HPV VACCINES (3 - 2-dose series) 02/19/2011  ? PAP-Cervical Cytology Screening  Never done  ? PAP SMEAR-Modifier  Never done  ? ? ?The following portions of the  patient's history were reviewed and updated as appropriate: allergies, current medications, past family history, past medical history, past social history, past surgical history, and problem list. ? ?Review of Systems ?Pertinent items are noted in HPI. ? ?Objective:  ?BP (!) 142/91   Pulse 90   Wt 231 lb 6.4 oz (105 kg)   LMP  (LMP Unknown)   Breastfeeding Yes Comment: pumping, formula-Neosure  BMI 37.35 kg/m?   ? ?General:  alert, cooperative, and appears stated age  ? Breasts:  not indicated  ?Lungs: clear to auscultation bilaterally  ?Heart:  regular rate and rhythm, S1, S2 normal, no murmur, click, rub or gallop  ?Abdomen: soft, non-tender; bowel sounds normal; no masses,  no organomegaly   ?Wound well approximated incision  ?GU exam:  not indicated  ?     ?Assessment:  ? ?Normal postpartum exam.  ? ?Plan:  ? ?Essential components of care per ACOG recommendations: ? ?1.  Mood and well being: Patient with positive depression screening today. Reviewed local resources for support.  ?- Patient tobacco use? No.   ?- hx of drug use? No.   ? ?2. Infant care and feeding:  ?-Patient currently breastmilk feeding? Yes. Reviewed importance of draining breast regularly to support lactation.  ?-Social determinants of  health (SDOH) reviewed in EPIC. -- Lack of care since CS in Nov ? ?3. Sexuality, contraception and birth spacing ?- Patient does not want a pregnancy in the next year.  Desired family size is unsure ?- Reviewed reproductive life planning. Reviewed contraceptive methods based on pt preferences and effectiveness.  Patient desired Hormonal Implant today.   ?- Discussed birth spacing of 18 months ? ?4. Sleep and fatigue ?-Encouraged family/partner/community support of 4 hrs of uninterrupted sleep to help with mood and fatigue ? ?5. Physical Recovery  ?- Discussed patients delivery and complications. She describes her labor as good. ?- Patient had a C-section emergent.  ?- Patient has urinary incontinence? No. ?-  Patient is safe to resume physical and sexual activity ? ?6.  Health Maintenance ?- HM due items addressed Yes ?- Last pap smear No results found for: DIAGPAP Pap smear done at today's visit.  ?- Declined PAP today ?-Breast Cancer screening indicated? No.  ? ?7. Chronic Disease/Pregnancy Condition follow up: ? ?1. Severe Preeclampsia ?- DX at 25 weeks, admitted to hospital and ultimately transferred to [facility] due to NICU capacity. She was monitored and delivered at [redacted]w[redacted]d via c-section (LTCS) ?- BP is elevated today and patient has not been on procardia for "weeks" ?- AMB Referral to Cardio Obstetrics ?- Resume procardia 60mg  BID at prior dose. Patient does not need RX.   ?- 1 week BP check ? ?2. Postpartum depression ?Declined referral to [facility]  ?Monitor mood closely ? ? ?- PCP follow up ? ?[facility], MD ?Center for [facility], [facility] ? ?

## 2021-11-11 ENCOUNTER — Encounter: Payer: Self-pay | Admitting: Family Medicine

## 2021-11-11 ENCOUNTER — Telehealth: Payer: Medicaid Other | Admitting: Physician Assistant

## 2021-11-11 DIAGNOSIS — R3989 Other symptoms and signs involving the genitourinary system: Secondary | ICD-10-CM

## 2021-11-11 MED ORDER — CEPHALEXIN 500 MG PO CAPS: 500.0000 mg | ORAL_CAPSULE | Freq: Two times a day (BID) | ORAL | 0 refills | Status: DC

## 2021-11-11 NOTE — Progress Notes (Signed)
?Virtual Visit Consent  ? ?Julia Phillips, you are scheduled for a virtual visit with a [place] provider today.   ?  ?Just as with appointments in the office, your consent must be obtained to participate.  Your consent will be active for this visit and any virtual visit you may have with one of our providers in the next 365 days.   ?  ?If you have a MyChart account, a copy of this consent can be sent to you electronically.  All virtual visits are billed to your insurance company just like a traditional visit in the office.   ? ?As this is a virtual visit, video technology does not allow for your provider to perform a traditional examination.  This may limit your provider's ability to fully assess your condition.  If your provider identifies any concerns that need to be evaluated in person or the need to arrange testing (such as labs, EKG, etc.), we will make arrangements to do so.   ?  ?Although advances in technology are sophisticated, we cannot ensure that it will always work on either your end or our end.  If the connection with a video visit is poor, the visit may have to be switched to a telephone visit.  With either a video or telephone visit, we are not always able to ensure that we have a secure connection.    ? ?I need to obtain your verbal consent now.   Are you willing to proceed with your visit today?  ?  ?Julia Phillips has provided verbal consent on 11/11/2021 for a virtual visit (video or telephone). ?  ?Mar Daring, PA-C  ? ?Date: 11/11/2021 11:40 AM ? ? ?Virtual Visit via Video Note  ? ?Julia Phillips, connected with  Julia Phillips  ([id number], June 23, 2001) on 11/11/21 at 11:30 AM EDT by a video-enabled telemedicine application and verified that I am speaking with the correct person using two identifiers. ? ?Location: ?Patient: Virtual Visit Location Patient: Home ?Provider: Virtual Visit Location Provider: Home Office ?  ?I discussed the limitations of evaluation and  management by telemedicine and the availability of in person appointments. The patient expressed understanding and agreed to proceed.   ? ?History of Present Illness: ?Julia Phillips is a 21 y.o. who identifies as a female who was assigned female at birth, and is being seen today for possible UTI. ? ?HPI: Urinary Tract Infection  ?This is a new problem. The current episode started in the past 7 days (right at a week). The problem occurs every urination. The problem has been gradually worsening. The quality of the pain is described as aching. The pain is mild. There has been no fever. Associated symptoms include frequency, hesitancy and urgency. Pertinent negatives include no chills, discharge, flank pain, hematuria, nausea, possible pregnancy, sweats or vomiting. Associated symptoms comments: Cloudy urine. She has tried increased fluids for the symptoms. The treatment provided no relief. Her past medical history is significant for catheterization (a few months ago when delivering). There is no history of recurrent UTIs or a urological procedure.   ? ? ?Problems:  ?Patient Active Problem List  ? Diagnosis Date Noted  ? Preeclampsia, severe 11/04/2021  ? Scoliosis 03/29/2021  ?  ?Allergies: No Known Allergies ?Medications:  ?Current Outpatient Medications:  ?  cephALEXin (KEFLEX) 500 MG capsule, Take 1 capsule (500 mg total) by mouth 2 (two) times daily., Disp: 14 capsule, Rfl: 0 ?  etonogestrel (NEXPLANON) 68 MG IMPL implant,  68 mg by Subdermal route., Disp: , Rfl:  ?  NIFEdipine (PROCARDIA XL) 60 MG 24 hr tablet, Take 1 tablet (60 mg total) by mouth 2 (two) times daily. (Patient not taking: Reported on 11/04/2021), Disp: 60 tablet, Rfl: 0 ? ?Observations/Objective: ?Patient is well-developed, well-nourished in no acute distress.  ?Resting comfortably at home.  ?Head is normocephalic, atraumatic.  ?No labored breathing.  ?Speech is clear and coherent with logical content.  ?Patient is alert and oriented at  baseline.  ? ? ?Assessment and Plan: ?1. Suspected UTI ?- cephALEXin (KEFLEX) 500 MG capsule; Take 1 capsule (500 mg total) by mouth 2 (two) times daily.  Dispense: 14 capsule; Refill: 0 ? ?- Worsening symptoms.  ?- Will treat empirically with Keflex.  ?- Continue to push fluids.  ?- She is to seek in person evaluation if symptoms do not improve or if they worsen.  ? ? ?Follow Up Instructions: ?I discussed the assessment and treatment plan with the patient. The patient was provided an opportunity to ask questions and all were answered. The patient agreed with the plan and demonstrated an understanding of the instructions.  A copy of instructions were sent to the patient via MyChart unless otherwise noted below.  ? ? ?The patient was advised to call back or seek an in-person evaluation if the symptoms worsen or if the condition fails to improve as anticipated. ? ?Time:  ?I spent 10 minutes with the patient via telehealth technology discussing the above problems/concerns.   ? ?Mar Daring, PA-C ?

## 2021-11-11 NOTE — Patient Instructions (Signed)
?Haze Justin, thank you for joining Margaretann Loveless, PA-C for today's virtual visit.  While this provider is not your primary care provider (PCP), if your PCP is located in our provider database this encounter information will be shared with them immediately following your visit. ? ?Consent: ?(Patient) Julia Phillips provided verbal consent for this virtual visit at the beginning of the encounter. ? ?Current Medications: ? ?Current Outpatient Medications:  ?  cephALEXin (KEFLEX) 500 MG capsule, Take 1 capsule (500 mg total) by mouth 2 (two) times daily., Disp: 14 capsule, Rfl: 0 ?  etonogestrel (NEXPLANON) 68 MG IMPL implant, 68 mg by Subdermal route., Disp: , Rfl:  ?  NIFEdipine (PROCARDIA XL) 60 MG 24 hr tablet, Take 1 tablet (60 mg total) by mouth 2 (two) times daily. (Patient not taking: Reported on 11/04/2021), Disp: 60 tablet, Rfl: 0  ? ?Medications ordered in this encounter:  ?Meds ordered this encounter  ?Medications  ? cephALEXin (KEFLEX) 500 MG capsule  ?  Sig: Take 1 capsule (500 mg total) by mouth 2 (two) times daily.  ?  Dispense:  14 capsule  ?  Refill:  0  ?  Order Specific Question:   Supervising Provider  ?  Answer:   Eber Hong [[id number]]  ?  ? ?*If you need refills on other medications prior to your next appointment, please contact your pharmacy* ? ?Follow-Up: ?Call back or seek an in-person evaluation if the symptoms worsen or if the condition fails to improve as anticipated. ? ?Other Instructions ?Urinary Tract Infection, Adult ?A urinary tract infection (UTI) is an infection of any part of the urinary tract. The urinary tract includes the kidneys, ureters, bladder, and urethra. These organs make, store, and get rid of urine in the body. ?An upper UTI affects the ureters and kidneys. A lower UTI affects the bladder and urethra. ?What are the causes? ?Most urinary tract infections are caused by bacteria in your genital area around your urethra, where urine leaves your body. These  bacteria grow and cause inflammation of your urinary tract. ?What increases the risk? ?You are more likely to develop this condition if: ?You have a urinary catheter that stays in place. ?You are not able to control when you urinate or have a bowel movement (incontinence). ?You are female and you: ?Use a spermicide or diaphragm for birth control. ?Have low estrogen levels. ?Are pregnant. ?You have certain genes that increase your risk. ?You are sexually active. ?You take antibiotic medicines. ?You have a condition that causes your flow of urine to slow down, such as: ?An enlarged prostate, if you are female. ?Blockage in your urethra. ?A kidney stone. ?A nerve condition that affects your bladder control (neurogenic bladder). ?Not getting enough to drink, or not urinating often. ?You have certain medical conditions, such as: ?Diabetes. ?A weak disease-fighting system (immunesystem). ?Sickle cell disease. ?Gout. ?Spinal cord injury. ?What are the signs or symptoms? ?Symptoms of this condition include: ?Needing to urinate right away (urgency). ?Frequent urination. This may include small amounts of urine each time you urinate. ?Pain or burning with urination. ?Blood in the urine. ?Urine that smells bad or unusual. ?Trouble urinating. ?Cloudy urine. ?Vaginal discharge, if you are female. ?Pain in the abdomen or the lower back. ?You may also have: ?Vomiting or a decreased appetite. ?Confusion. ?Irritability or tiredness. ?A fever or chills. ?Diarrhea. ?The first symptom in older adults may be confusion. In some cases, they may not have any symptoms until the infection has worsened. ?How is  this diagnosed? ?This condition is diagnosed based on your medical history and a physical exam. You may also have other tests, including: ?Urine tests. ?Blood tests. ?Tests for STIs (sexually transmitted infections). ?If you have had more than one UTI, a cystoscopy or imaging studies may be done to determine the cause of the  infections. ?How is this treated? ?Treatment for this condition includes: ?Antibiotic medicine. ?Over-the-counter medicines to treat discomfort. ?Drinking enough water to stay hydrated. ?If you have frequent infections or have other conditions such as a kidney stone, you may need to see a health care provider who specializes in the urinary tract (urologist). ?In rare cases, urinary tract infections can cause sepsis. Sepsis is a life-threatening condition that occurs when the body responds to an infection. Sepsis is treated in the hospital with IV antibiotics, fluids, and other medicines. ?Follow these instructions at home: ?Medicines ?Take over-the-counter and prescription medicines only as told by your health care provider. ?If you were prescribed an antibiotic medicine, take it as told by your health care provider. Do not stop using the antibiotic even if you start to feel better. ?General instructions ?Make sure you: ?Empty your bladder often and completely. Do not hold urine for long periods of time. ?Empty your bladder after sex. ?Wipe from front to back after urinating or having a bowel movement if you are female. Use each tissue only one time when you wipe. ?Drink enough fluid to keep your urine pale yellow. ?Keep all follow-up visits. This is important. ?Contact a health care provider if: ?Your symptoms do not get better after 1-2 days. ?Your symptoms go away and then return. ?Get help right away if: ?You have severe pain in your back or your lower abdomen. ?You have a fever or chills. ?You have nausea or vomiting. ?Summary ?A urinary tract infection (UTI) is an infection of any part of the urinary tract, which includes the kidneys, ureters, bladder, and urethra. ?Most urinary tract infections are caused by bacteria in your genital area. ?Treatment for this condition often includes antibiotic medicines. ?If you were prescribed an antibiotic medicine, take it as told by your health care provider. Do not stop  using the antibiotic even if you start to feel better. ?Keep all follow-up visits. This is important. ?This information is not intended to replace advice given to you by your health care provider. Make sure you discuss any questions you have with your health care provider. ?Document Revised: 03/16/2020 Document Reviewed: 03/16/2020 ?Elsevier Patient Education ? 2022 [employer] ? ? ? ?If you have been instructed to have an in-person evaluation today at a local Urgent Care facility, please use the link below. It will take you to a list of all of our available [facility], including address, phone number and hours of operation. Please do not delay care.  ?[facility] ? ?If you or a family member do not have a primary care provider, use the link below to schedule a visit and establish care. When you choose a [facility] primary care physician or advanced practice provider, you gain a long-term partner in health. ?Find a Primary Care Provider ? ?Learn more about [facility] in-office and virtual care options: ?[facility] Get Care Now ?

## 2021-11-12 ENCOUNTER — Other Ambulatory Visit: Payer: Self-pay

## 2021-11-12 ENCOUNTER — Ambulatory Visit (INDEPENDENT_AMBULATORY_CARE_PROVIDER_SITE_OTHER): Payer: Medicaid Other | Admitting: Family Medicine

## 2021-11-12 ENCOUNTER — Other Ambulatory Visit (HOSPITAL_COMMUNITY)
Admission: RE | Admit: 2021-11-12 | Discharge: 2021-11-12 | Disposition: A | Discharge status: Home / Self Care | Payer: Medicaid Other | Source: Ambulatory Visit | Attending: Family Medicine | Admitting: Family Medicine

## 2021-11-12 VITALS — BP 137/96 | HR 96 | Wt 231.0 lb

## 2021-11-12 DIAGNOSIS — Z124 Encounter for screening for malignant neoplasm of cervix: Secondary | ICD-10-CM

## 2021-11-12 DIAGNOSIS — I10 Essential (primary) hypertension: Secondary | ICD-10-CM | POA: Diagnosis not present

## 2021-11-12 MED ORDER — NIFEDIPINE ER 30 MG PO TB24: 30.0000 mg | ORAL_TABLET | Freq: Every day | ORAL | 3 refills | Status: DC

## 2021-11-12 NOTE — Assessment & Plan Note (Signed)
Counseled patient that HTN often has no symptoms and for this reason complications can be very insidious over time. Strongly recommended she start Nifedipine 30 mg XL as her BP elevation is only mild to help prevent long term complications. We also discussed that severe pre-e predisposes her to higher lifetime risk of HTN which she already has as well as other long term health risks so its important to get it under control now. She agrees to start Nifedipine, will see her back in one month for BP check.  ?

## 2021-11-12 NOTE — Progress Notes (Signed)
? ?[facility] OFFICE VISIT NOTE ? ?History:  ? Julia Phillips is a 21 y.o. G1P0101 here today for BP check. ? ?Patient seen for PP visit on 11/04/2021 by Dr. Alvester Morin ?At that time had elevated BP of 142/91 ?Has not been taking Nifedipine as she feels fine ? ?Health Maintenance Due  ?Topic Date Due  ? HPV VACCINES (3 - 2-dose series) 02/19/2011  ? PAP-Cervical Cytology Screening  Never done  ? PAP SMEAR-Modifier  Never done  ? ? ?Past Medical History:  ?Diagnosis Date  ? Depression   ? Pregnancy induced hypertension   ? Scoliosis   ? ? ?Past Surgical History:  ?Procedure Laterality Date  ? CESAREAN SECTION    ? ? ?The following portions of the patient's history were reviewed and updated as appropriate: allergies, current medications, past family history, past medical history, past social history, past surgical history and problem list.  ? ?Health Maintenance:   ?Last pap: ?No results found for: DIAGPAP, HPV, HPVHIGH ?*needs* ? ?Last mammogram:  ?N/a  ? ? ?Review of Systems:  ?Pertinent items noted in HPI and remainder of comprehensive ROS otherwise negative. ? ?Physical Exam:  ?BP (!) 137/96   Pulse 96   Wt 231 lb (104.8 kg)   LMP  (LMP Unknown)   BMI 37.28 kg/m?  ?CONSTITUTIONAL: Well-developed, well-nourished female in no acute distress.  ?HEENT:  Normocephalic, atraumatic. External right and left ear normal. No scleral icterus.  ?NECK: Normal range of motion, supple, no masses noted on observation ?SKIN: No rash noted. Not diaphoretic. No erythema. No pallor. ?MUSCULOSKELETAL: Normal range of motion. No edema noted. ?NEUROLOGIC: Alert and oriented to person, place, and time. Normal muscle tone coordination.  ?PSYCHIATRIC: Normal mood and affect. Normal behavior. Normal judgment and thought content. ?RESPIRATORY: Effort normal, no problems with respiration noted ?ABDOMEN: No masses noted. No other overt distention noted.   ?PELVIC:  Normal external and internal genitalia, normal cervix,  no discharge ? ?Labs and Imaging ?No results found for this or any previous visit (from the past 168 hour(s)). ?No results found.    ?Assessment and Plan:  ? ?Problem List Items Addressed This Visit   ? ?  ? Cardiovascular and Mediastinum  ? Essential hypertension - Primary  ?  Counseled patient that HTN often has no symptoms and for this reason complications can be very insidious over time. Strongly recommended she start Nifedipine 30 mg XL as her BP elevation is only mild to help prevent long term complications. We also discussed that severe pre-e predisposes her to higher lifetime risk of HTN which she already has as well as other long term health risks so its important to get it under control now. She agrees to start Nifedipine, will see her back in one month for BP check.  ?  ?  ? Relevant Medications  ? NIFEdipine (ADALAT CC) 30 MG 24 hr tablet  ? ?Other Visit Diagnoses   ? ? Screening for cervical cancer      ? Relevant Orders  ? Cytology - PAP( [facility])  ? ?  ?Pap collected today.   ? ? ?Routine preventative health maintenance measures emphasized. ?Please refer to After Visit Summary for other counseling recommendations.  ? ?Return in about 4 weeks (around 12/10/2021) for BP check.   ? ?Total face-to-face time with patient: 15 minutes.  Over 50% of encounter was spent on counseling and coordination of care. ? ? ?Venora Maples, MD/MPH ?Attending Family Medicine Physician, Faculty Practice ?  [facility] for [employer], [facility] ? ?

## 2021-11-14 NOTE — Progress Notes (Signed)
NILM but TZ absent, clinical pool please call and ask for HPV to be added on so we can determine if she needs a repeat in a few months or can do routine screening

## 2021-11-15 ENCOUNTER — Encounter: Payer: Self-pay | Admitting: *Deleted

## 2021-11-15 ENCOUNTER — Ambulatory Visit: Payer: Medicaid Other | Admitting: Cardiology

## 2021-11-15 LAB — CYTOLOGY - PAP
Adequacy: ABSENT
Chlamydia: NEGATIVE
Comment: NEGATIVE
Comment: NEGATIVE
Comment: NEGATIVE
Comment: NORMAL
Diagnosis: NEGATIVE
High risk HPV: NEGATIVE
Neisseria Gonorrhea: NEGATIVE
Trichomonas: NEGATIVE

## 2021-11-15 NOTE — Progress Notes (Signed)
Dr. Crissie Reese requested hpv be added on to pap testing . Cytology notified by Henriette Combs 11/14/21. I called to verify today. ?Nancy Fetter ?

## 2021-11-25 ENCOUNTER — Ambulatory Visit: Payer: Medicaid Other | Admitting: Neurology

## 2021-11-25 ENCOUNTER — Encounter: Payer: Self-pay | Admitting: Neurology

## 2021-11-25 DIAGNOSIS — Z029 Encounter for administrative examinations, unspecified: Secondary | ICD-10-CM

## 2021-12-13 ENCOUNTER — Ambulatory Visit: Payer: Medicaid Other

## 2021-12-13 ENCOUNTER — Encounter: Payer: Self-pay | Admitting: *Deleted

## 2021-12-19 ENCOUNTER — Ambulatory Visit: Payer: Medicaid Other

## 2021-12-21 ENCOUNTER — Other Ambulatory Visit: Payer: Self-pay

## 2021-12-21 ENCOUNTER — Encounter (HOSPITAL_COMMUNITY): Payer: Self-pay

## 2021-12-21 ENCOUNTER — Emergency Department (HOSPITAL_COMMUNITY)
Admission: EM | Admit: 2021-12-21 | Discharge: 2021-12-21 | Disposition: A | Discharge status: Home / Self Care | Payer: Medicaid Other | Attending: Emergency Medicine | Admitting: Emergency Medicine

## 2021-12-21 ENCOUNTER — Emergency Department (HOSPITAL_COMMUNITY): Payer: Medicaid Other

## 2021-12-21 DIAGNOSIS — R0789 Other chest pain: Secondary | ICD-10-CM

## 2021-12-21 DIAGNOSIS — I10 Essential (primary) hypertension: Secondary | ICD-10-CM | POA: Insufficient documentation

## 2021-12-21 LAB — CBC
HCT: 35.6 % — ABNORMAL LOW (ref 36.0–46.0)
Hemoglobin: 11.6 g/dL — ABNORMAL LOW (ref 12.0–15.0)
MCH: 26.2 pg (ref 26.0–34.0)
MCHC: 32.6 g/dL (ref 30.0–36.0)
MCV: 80.5 fL (ref 80.0–100.0)
Platelets: 391 10*3/uL (ref 150–400)
RBC: 4.42 MIL/uL (ref 3.87–5.11)
RDW: 14.1 % (ref 11.5–15.5)
WBC: 5.9 10*3/uL (ref 4.0–10.5)
nRBC: 0 % (ref 0.0–0.2)

## 2021-12-21 LAB — I-STAT BETA HCG BLOOD, ED (MC, WL, AP ONLY): I-stat hCG, quantitative: <5 m[IU]/mL (ref <5)

## 2021-12-21 LAB — BASIC METABOLIC PANEL
Anion gap: 7 (ref 5–15)
BUN: 8 mg/dL (ref 6–20)
CO2: 23 mmol/L (ref 22–32)
Calcium: 9.3 mg/dL (ref 8.9–10.3)
Chloride: 108 mmol/L (ref 98–111)
Creatinine, Ser: 0.67 mg/dL (ref 0.44–1.00)
GFR, Estimated: >60 mL/min (ref >60)
Glucose, Bld: 96 mg/dL (ref 70–99)
Potassium: 3.5 mmol/L (ref 3.5–5.1)
Sodium: 138 mmol/L (ref 135–145)

## 2021-12-21 LAB — TROPONIN I (HIGH SENSITIVITY)
Troponin I (High Sensitivity): <2 ng/L (ref <18)
Troponin I (High Sensitivity): <2 ng/L (ref <18)

## 2021-12-21 MED ORDER — ACETAMINOPHEN 325 MG PO TABS: 650.0000 mg | ORAL_TABLET | Freq: Four times a day (QID) | ORAL | Status: DC | PRN

## 2021-12-21 MED ORDER — ALUM & MAG HYDROXIDE-SIMETH 200-200-20 MG/5ML PO SUSP
15.0000 mL | Freq: Once | ORAL | Status: AC
  Administered 2021-12-21: 15 mL via ORAL
  Filled 2021-12-21: qty 30

## 2021-12-21 MED ORDER — DICLOFENAC SODIUM 75 MG PO TBEC: 75.0000 mg | DELAYED_RELEASE_TABLET | Freq: Two times a day (BID) | ORAL | 0 refills | Status: DC

## 2021-12-21 NOTE — Discharge Instructions (Addendum)
Return if any problems.

## 2021-12-21 NOTE — ED Triage Notes (Signed)
Pt was eating today and began having chest tightness on the left side. Also endorse SOB. Chest pain continues. Pain radiates to back. Pt has history of hypertension. ?

## 2021-12-21 NOTE — ED Provider Notes (Signed)
?[facility] EMERGENCY DEPARTMENT ?Provider Phillips ? ? ?CSN: [id number] ?Arrival date & time: 12/21/21  0307 ? ?  ? ?History ? ?Chief Complaint  ?Patient presents with  ? Chest Pain  ? ? ?Julia Phillips is a 21 y.o. female. ? ?Pt reports she developed chest pain while eating crab legs.  Pt reports she has also had a headache.  Pt has a history of hypertension.  Pt stopped blood pressure medicaitons after child was born  ? ?The history is provided by the patient. No language interpreter was used.  ?Chest Pain ?Pain location:  L chest ?Pain quality: aching   ?Pain radiates to:  Does not radiate ?Pain severity:  Moderate ?Onset quality:  Gradual ?Timing:  Constant ?Progression:  Worsening ?Chronicity:  New ?Context: eating   ?Context: not breathing   ?Relieved by:  Nothing ?Worsened by:  Nothing ?Ineffective treatments:  None tried ?Associated symptoms: no abdominal pain, no altered mental status and no fever   ?Risk factors: hypertension   ? ?  ? ?Home Medications ?Prior to Admission medications   ?Medication Sig Start Date End Date Taking? Authorizing Provider  ?diclofenac (VOLTAREN) 75 MG EC tablet Take 1 tablet (75 mg total) by mouth 2 (two) times daily. 12/21/21  Yes Cheron Schaumann K, PA-C  ?etonogestrel (NEXPLANON) 68 MG IMPL implant 68 mg by Subdermal route.   Yes [provider]  ?fluticasone (FLONASE) 50 MCG/ACT nasal spray Place 1 spray into both nostrils 2 (two) times daily as needed for allergies or rhinitis.   Yes [provider]  ?ibuprofen (ADVIL) 600 MG tablet Take 600 mg by mouth 2 (two) times daily as needed for headache.   Yes [provider]  ?cephALEXin (KEFLEX) 500 MG capsule Take 1 capsule (500 mg total) by mouth 2 (two) times daily. ?Patient not taking: Reported on 12/21/2021 11/11/21   Margaretann Loveless, PA-C  ?NIFEdipine (ADALAT CC) 30 MG 24 hr tablet Take 1 tablet (30 mg total) by mouth daily. ?Patient not taking: Reported on 12/21/2021 11/12/21 12/12/21   Venora Maples, MD  ?   ? ?Allergies    ?Patient has no known allergies.   ? ?Review of Systems   ?Review of Systems  ?Constitutional:  Negative for fever.  ?Cardiovascular:  Positive for chest pain.  ?Gastrointestinal:  Negative for abdominal pain.  ?All other systems reviewed and are negative. ? ?Physical Exam ?Updated Vital Signs ?BP 114/71   Pulse 81   Temp 98.3 ?F (36.8 ?C) (Oral)   Resp (!) 21   Ht 5\' 6"  (1.676 m)   Wt 96.6 kg   SpO2 98%   BMI 34.38 kg/m?  ?Physical Exam ?Vitals and nursing Phillips reviewed.  ?Constitutional:   ?   Appearance: She is well-developed.  ?HENT:  ?   Head: Normocephalic.  ?Cardiovascular:  ?   Rate and Rhythm: Normal rate and regular rhythm.  ?   Heart sounds: Normal heart sounds.  ?Pulmonary:  ?   Effort: Pulmonary effort is normal.  ?   Breath sounds: Normal breath sounds.  ?Abdominal:  ?   General: There is no distension.  ?   Palpations: Abdomen is soft.  ?Musculoskeletal:     ?   General: Normal range of motion.  ?   Cervical back: Normal range of motion.  ?Skin: ?   General: Skin is warm.  ?Neurological:  ?   General: No focal deficit present.  ?   Mental Status: She is alert and oriented  to person, place, and time.  ?Psychiatric:     ?   Mood and Affect: Mood normal.  ? ? ?ED Results / Procedures / Treatments   ?Labs ?(all labs ordered are listed, but only abnormal results are displayed) ?Labs Reviewed  ?CBC - Abnormal; Notable for the following components:  ?    Result Value  ? Hemoglobin 11.6 (*)   ? HCT 35.6 (*)   ? All other components within normal limits  ?BASIC METABOLIC PANEL  ?I-STAT BETA HCG BLOOD, ED ([facility], [facility], [facility] ONLY)  ?TROPONIN I (HIGH SENSITIVITY)  ?TROPONIN I (HIGH SENSITIVITY)  ? ? ?EKG ?None ? ?Radiology ?DG Chest 2 View ? ?Result Date: 12/21/2021 ?CLINICAL DATA:  Chest pain. EXAM: CHEST - 2 VIEW COMPARISON:  None Available. FINDINGS: The heart size and mediastinal contours are within normal limits. Both lungs are clear. There is dextroconvex curvature  of the midthoracic spine. No acute fractures are seen. IMPRESSION: No active cardiopulmonary disease. Electronically Signed   By: Darliss Cheney M.D.   On: 12/21/2021 04:01   ? ?Procedures ?Procedures  ? ? ?Medications Ordered in ED ?Medications  ?acetaminophen (TYLENOL) tablet 650 mg (has no administration in time range)  ?alum & mag hydroxide-simeth (MAALOX/MYLANTA) 200-200-20 MG/5ML suspension 15 mL (15 mLs Oral Given 12/21/21 0903)  ? ? ?ED Course/ Medical Decision Making/ A&P ?  ?                        ?Medical Decision Making ?Pt complains of soreness chest soreness.  Pt reports symptoms began while eating crab legs.   ? ?Amount and/or Complexity of Data Reviewed ?Independent Historian: spouse ?   Details: Spouse here and supportive ?External Data Reviewed: notes. ?   Details: OB notes reviewed.  Pt had hypertension during pregnancy ?Labs: ordered. Decision-making details documented in ED Course. ?   Details: CBC is normal ?Radiology: ordered and independent interpretation performed. Decision-making details documented in ED Course. ?   Details: Chest xray is normal ?ECG/medicine tests: ordered and independent interpretation performed. Decision-making details documented in ED Course. ?   Details: EKG  normal sinus normal ekg ? ?Risk ?OTC drugs. ?Prescription drug management. ?Risk Details: I suspect chest pain is muscular.  No pe risk.  Pt feels better with tylenol.   ? ? ? ? ? ? ? ? ? ? ?Final Clinical Impression(s) / ED Diagnoses ?Final diagnoses:  ?Chest wall pain  ? ? ?Rx / DC Orders ?ED Discharge Orders   ? ?      Ordered  ?  diclofenac (VOLTAREN) 75 MG EC tablet  2 times daily       ? 12/21/21 1141  ? ?  ?  ? ?  ? ?An After Visit Summary was printed and given to the patient. ? ?  ?Elson Areas, [place] ?12/21/21 1517 ? ?  ?Terald Sleeper, MD ?12/21/21 1559 ? ?

## 2021-12-26 NOTE — Progress Notes (Signed)
?[facility] ? ?New Evaluation ? ?Date:  12/27/2021  ? ?ID:  Julia Phillips, DOB May 02, 2001, MRN [id number] ? ?PCP:  [facility] [place] ?  ?[facility] Providers ?Cardiologist:  None  ?Electrophysiologist:  None      ? ?Referring MD: Caren Macadam,*  ? ?Chief Complaint: HTN ? ?History of Present Illness:   ? ?Julia Phillips is a 21 y.o. female [id number] who is being seen today for the evaluation of HTN at the request of Caren Macadam,*.  ? ?Patient developed severe pre-eclampsia at 25 weeks requiring transfer to [facility] due to NICU capacity. She was ultimately delivered at 27w2 days via c-section. She was discharged on labetalol and procardia. Was last seen by Dr. Ernestina Patches where BP remained elevated in her office prompting referral here.   ? ?Today, the patient states that she overall feels well. No known pre-existing hypertension prior to pregnancy but developed gestational hypertension around 20w. She was subsequently diagnosed pre-eclampsia about 1 week later requiring hospitalization at [facility]. Was delivered on 07/12/22.  ? ?Since that time, she has had persistently elevated blood pressures. Was placed on nifedipine but developed LE edema and has since stopped the medication. Has a family history of hypertension in her mom but she was not diagnosed at a young age. She is currently on nexplanon for birth control. No significant NSAID use.  ? ?Prior CV Studies Reviewed: ?The following studies were reviewed today: ?No CV studies ? ?Past Medical History:  ?Diagnosis Date  ? Depression   ? Pregnancy induced hypertension   ? Scoliosis   ? ? ?Past Surgical History:  ?Procedure Laterality Date  ? CESAREAN SECTION    ?   ? ?OB History   ? ? Gravida  ?1  ? Para  ?1  ? Term  ?0  ? Preterm  ?1  ? AB  ?0  ? Living  ?1  ?  ? ? SAB  ?0  ? IAB  ?0  ? Ectopic  ?0  ? Multiple  ?0  ? Live Births  ?1  ?   ?  ?  ?    ? ? ?Current Medications: ?Current Meds  ?Medication Sig  ?  amLODipine (NORVASC) 5 MG tablet Take 1 tablet (5 mg total) by mouth daily.  ? etonogestrel (NEXPLANON) 68 MG IMPL implant 68 mg by Subdermal route.  ? fluticasone (FLONASE) 50 MCG/ACT nasal spray Place 1 spray into both nostrils 2 (two) times daily as needed for allergies or rhinitis.  ? ibuprofen (ADVIL) 600 MG tablet Take 600 mg by mouth 2 (two) times daily as needed for headache.  ?  ? ?Allergies:   Patient has no known allergies.  ? ?Social History  ? ?Socioeconomic History  ? Marital status: Significant Other  ?  Spouse name: Not on file  ? Number of children: Not on file  ? Years of education: Not on file  ? Highest education level: Not on file  ?Occupational History  ? Not on file  ?Tobacco Use  ? Smoking status: Former  ?  Types: E-cigarettes  ?  Start date: 01/30/2021  ?  Passive exposure: Yes  ? Smokeless tobacco: Never  ?Vaping Use  ? Vaping Use: Former  ?Substance and Sexual Activity  ? Alcohol use: Not Currently  ?  Comment: rare  ? Drug use: Not Currently  ?  Types: Marijuana  ?  Comment: last use today 24 Jun 2021  ? Sexual activity: Yes  ?  Partners: Male  ?  Birth control/protection: Implant  ?Other Topics Concern  ? Not on file  ?Social History Narrative  ? Not on file  ? ?Social Determinants of Health  ? ?Financial Resource Strain: Not on file  ?Food Insecurity: No Food Insecurity  ? Worried About Charity fundraiser in the Last Year: Never true  ? Ran Out of Food in the Last Year: Never true  ?Transportation Needs: No Transportation Needs  ? Lack of Transportation (Medical): No  ? Lack of Transportation (Non-Medical): No  ?Physical Activity: Not on file  ?Stress: Not on file  ?Social Connections: Not on file  ?  ? ? ?Family History  ?Problem Relation Age of Onset  ? Diabetes Mother   ? Diabetes Paternal Grandmother   ?   ? ?ROS:   ?Please see the history of present illness.    ?Review of Systems  ?Constitutional:  Negative for malaise/fatigue.  ?Respiratory:  Negative for shortness of breath.    ?Cardiovascular:  Negative for chest pain, palpitations, orthopnea, claudication, leg swelling and PND.  ?Neurological:  Negative for dizziness and loss of consciousness.   ?All other systems reviewed and are negative. ? ? ?Labs/EKG Reviewed:   ? ?EKG:   ?EKG 12/23/21: NSR ? ?Recent Labs: ?07/27/2021: ALT 14 ?12/21/2021: BUN 8; Creatinine, Ser 0.67; Hemoglobin 11.6; Platelets 391; Potassium 3.5; Sodium 138  ? ?Recent Lipid Panel ?No results found for: CHOL, TRIG, HDL, CHOLHDL, LDLCALC, LDLDIRECT ? ?Physical Exam:   ? ?VS:  BP (!) 140/96   Pulse 86   Ht 5\' 6"  (1.676 m)   Wt 226 lb (102.5 kg)   SpO2 98%   BMI 36.48 kg/m?    ? ?Wt Readings from Last 3 Encounters:  ?12/27/21 226 lb (102.5 kg)  ?12/21/21 213 lb (96.6 kg)  ?11/12/21 231 lb (104.8 kg)  ?  ? ?GEN:  Well nourished, well developed in no acute distress ?HEENT: Normal ?NECK: No JVD; No carotid bruits ?CARDIAC: RRR, no murmurs, rubs, gallops ?RESPIRATORY:  Clear to auscultation without rales, wheezing or rhonchi  ?ABDOMEN: Soft, non-tender, non-distended ?MUSCULOSKELETAL:  No edema; No deformity  ?SKIN: Warm and dry ?NEUROLOGIC:  Alert and oriented x 3 ?PSYCHIATRIC:  Normal affect  ? ?  ? ? ?ASSESSMENT & PLAN:   ? ?#HTN: ?#Severe Pre-eclampsia in Pregnancy: ?Patient with severe pre-eclampsia requiring admission and early delivery at 27w via c-section. BP has been persistently elevated in the post-partum period; currently in 140s. She did not tolerate nifedipine due to LE edema. Given that she has severe hypertension at such a young age, she merits secondary work-up. Will check renal doppler ultrasound, BMET, TSH and renin/aldosterone levels.  Extensive counseling provided regarding importance of BP control as well as significant risk of developing recurrent pre-eclampsia in subsequent pregnancies.  ?-Change nifedipine to amlodipine 5mg  daily and monitor response ?-Not a great candidate for HCTZ due to hypokalemia ?-Check renal dopplers, renin/aldo, TSH and  BMET ?-If recurrent LE edema with amlodipine, can switch to ARB however would need to change back to labetalol/nifedipine if she were to become pregnant again ?-She is on nexplanon for birth control which can cause HTN so if above work-up is negative, can consider switching contraceptive agent ?-Discussed importance of BP control to help prevent future complications including CV disease as well as neurologic and renal complications ? ?Patient Instructions  ?Medication Instructions:  ? ?STOP TAKING NIFEDIPINE NOW ? ?START TAKING AMLODIPINE 5 MG BY MOUTH DAILY ? ?*If you need a refill  on your cardiac medications before your next appointment, please call your pharmacy* ? ? ?Lab Work: ? ?NEXT WEEK AT OUR [address] OFFICE--[address] --WE WILL CHECK BMET, TSH, AND ALDOSTERONE + RENIN ACTIVITY W/ RATIO LEVEL ? ?If you have labs (blood work) drawn today and your tests are completely normal, you will receive your results only by: ?MyChart Message (if you have MyChart) OR ?A paper copy in the mail ?If you have any lab test that is abnormal or we need to change your treatment, we will call you to review the results. ? ? ?Testing/Procedures: ? ?Your physician has requested that you have a renal artery duplex. During this test, an ultrasound is used to evaluate blood flow to the kidneys. Allow one hour for this exam. Do not eat after midnight the day before and avoid carbonated beverages. Take your medications as you usually do. ? ? ?Follow-Up: ? ?4-6 WEEKS WITH DR. Johney Frame HERE AT [place] ? ? ?Important Information About Sugar ? ? ? ? ? ? ? ? ?Dispo:  No follow-ups on file.  ? ?Medication Adjustments/Labs and Tests Ordered: ?Current medicines are reviewed at length with the patient today.  Concerns regarding medicines are outlined above.  ?Tests Ordered: ?Orders Placed This Encounter  ?Procedures  ? TSH  ? Basic metabolic panel  ? Aldosterone + renin activity w/ ratio  ? VAS US RENAL ARTERY DUPLEX   ? ?Medication Changes: ?Meds ordered this encounter  ?Medications  ? amLODipine (NORVASC) 5 MG tablet  ?  Sig: Take 1 tablet (5 mg total) by mouth daily.  ?  Dispense:  90 tablet  ?  Refill:  2  ?  ?

## 2021-12-27 ENCOUNTER — Ambulatory Visit (INDEPENDENT_AMBULATORY_CARE_PROVIDER_SITE_OTHER): Payer: Medicaid Other | Admitting: Cardiology

## 2021-12-27 ENCOUNTER — Encounter: Payer: Self-pay | Admitting: Cardiology

## 2021-12-27 VITALS — BP 140/96 | HR 86 | Ht 66.0 in | Wt 226.0 lb

## 2021-12-27 DIAGNOSIS — I10 Essential (primary) hypertension: Secondary | ICD-10-CM

## 2021-12-27 DIAGNOSIS — O1410 Severe pre-eclampsia, unspecified trimester: Secondary | ICD-10-CM

## 2021-12-27 MED ORDER — AMLODIPINE BESYLATE 5 MG PO TABS: 5.0000 mg | ORAL_TABLET | Freq: Every day | ORAL | 2 refills | Status: DC

## 2021-12-27 NOTE — Patient Instructions (Signed)
Medication Instructions:  ? ?STOP TAKING NIFEDIPINE NOW ? ?START TAKING AMLODIPINE 5 MG BY MOUTH DAILY ? ?*If you need a refill on your cardiac medications before your next appointment, please call your pharmacy* ? ? ?Lab Work: ? ?NEXT WEEK AT OUR [address] OFF[facility]--[address] --WE WILL CHECK BMET, TSH, AND ALDOSTERONE + RENIN ACTIVITY W/ RATIO LEVEL ? ?If you have labs (blood work) drawn today and your tests are completely normal, you will receive your results only by: ?MyChart Message (if you have MyChart) OR ?A paper copy in the mail ?If you have any lab test that is abnormal or we need to change your treatment, we will call you to review the results. ? ? ?Testing/Procedures: ? ?Your physician has requested that you have a renal artery duplex. During this test, an ultrasound is used to evaluate blood flow to the kidneys. Allow one hour for this exam. Do not eat after midnight the day before and avoid carbonated beverages. Take your medications as you usually do. ? ? ?Follow-Up: ? ?4-6 WEEKS WITH DR. Johney Frame HERE AT [place] ? ? ?Important Information About Sugar ? ? ? ? ? ? ?

## 2022-01-02 ENCOUNTER — Other Ambulatory Visit: Payer: Medicaid Other

## 2022-01-03 ENCOUNTER — Other Ambulatory Visit: Payer: Medicaid Other | Admitting: *Deleted

## 2022-01-03 DIAGNOSIS — I10 Essential (primary) hypertension: Secondary | ICD-10-CM

## 2022-01-03 DIAGNOSIS — O1410 Severe pre-eclampsia, unspecified trimester: Secondary | ICD-10-CM

## 2022-01-04 ENCOUNTER — Encounter: Payer: Self-pay | Admitting: Cardiology

## 2022-01-09 ENCOUNTER — Other Ambulatory Visit: Payer: Self-pay | Admitting: Cardiology

## 2022-01-09 DIAGNOSIS — I10 Essential (primary) hypertension: Secondary | ICD-10-CM

## 2022-01-09 LAB — BASIC METABOLIC PANEL
BUN/Creatinine Ratio: 18 (ref 9–23)
BUN: 14 mg/dL (ref 6–20)
CO2: 22 mmol/L (ref 20–29)
Calcium: 9.6 mg/dL (ref 8.7–10.2)
Chloride: 102 mmol/L (ref 96–106)
Creatinine, Ser: 0.77 mg/dL (ref 0.57–1.00)
Glucose: 105 mg/dL — ABNORMAL HIGH (ref 70–99)
Potassium: 4.4 mmol/L (ref 3.5–5.2)
Sodium: 140 mmol/L (ref 134–144)
eGFR: 112 mL/min/{1.73_m2} (ref >59)

## 2022-01-09 LAB — TSH: TSH: 1.68 u[IU]/mL (ref 0.450–4.500)

## 2022-01-09 LAB — ALDOSTERONE + RENIN ACTIVITY W/ RATIO
ALDOS/RENIN RATIO: 13.3 (ref 0.0–30.0)
ALDOSTERONE: 7.4 ng/dL (ref 0.0–30.0)
Renin: 0.556 ng/mL/hr (ref 0.167–5.380)

## 2022-01-14 ENCOUNTER — Ambulatory Visit (HOSPITAL_COMMUNITY)
Admission: RE | Admit: 2022-01-14 | Discharge: 2022-01-14 | Disposition: A | Discharge status: Home / Self Care | Payer: Medicaid Other | Source: Ambulatory Visit | Attending: Cardiology | Admitting: Cardiology

## 2022-01-14 DIAGNOSIS — I10 Essential (primary) hypertension: Secondary | ICD-10-CM | POA: Diagnosis present

## 2022-02-06 NOTE — Progress Notes (Deleted)
Cardio-Obstetrics Clinic  Follow-up:  Date:  02/06/2022   ID:  SONDOS WOLFMAN, DOB [birth year], MRN [id number]  PCP:  [facility]   [facility] Providers Cardiologist:  None  Electrophysiologist:  None       Referring MD: [facility]   Chief Complaint: HTN  History of Present Illness:    Julia Phillips is a 21 y.o. female [[id number]] who presents to clinic for follow-up of HTN.  Patient developed severe pre-eclampsia at 25 weeks requiring transfer to [facility] due to NICU capacity. She was ultimately delivered at 27w2 days via c-section. She was discharged on labetalol and procardia. Was  seen by Dr. Alvester Morin where BP remained elevated in her office prompting referral to Cardio-OB clinic.    Was seen in clinic on 12/2021. No known pre-existing hypertension prior to pregnancy but developed gestational hypertension around 20w. She was subsequently diagnosed pre-eclampsia about 1 week later requiring hospitalization at [facility] as detailed above. Was delivered on 07/12/22. Since that time, she had persistently elevated blood pressures. Was placed on nifedipine but developed LE edema and has since stopped the medication. Has a family history of hypertension in her mom but she was not diagnosed at a young age. On nexplanon for birth control. No significant NSAID use.   During that visit, we changed nifedipine to amlidipine. Renin, aldosterone levels normal. TSH normal. Renal dopplers with 1-59% stenosis bilaterally.   Today, ***  Prior CV Studies Reviewed: The following studies were reviewed today: Renal Dopplers 01/14/22:    Summary:  Renal:     Right: 1-59% stenosis of the right renal artery. Normal size right         kidney. Normal right Resistive Index. Normal cortical         thickness of right kidney. RRV flow present.  Left:  1-59% stenosis of the left renal artery. Normal size of left         kidney. Normal left Resistive Index. Normal  cortical         thickness of the left kidney. LRV flow present.  Mesenteric:  Normal Superior Mesenteric artery and Celiac artery findings.     Patent IVC  *See table(s) above for measurements and observations.   Past Medical History:  Diagnosis Date   Depression    Pregnancy induced hypertension    Scoliosis     Past Surgical History:  Procedure Laterality Date   CESAREAN SECTION        OB History     Gravida  1   Para  1   Term  0   Preterm  1   AB  0   Living  1      SAB  0   IAB  0   Ectopic  0   Multiple  0   Live Births  1               Current Medications: No outpatient medications have been marked as taking for the 02/07/22 encounter (Appointment) with Meriam Sprague, MD.     Allergies:   Patient has no known allergies.   Social History   Socioeconomic History   Marital status: Significant Other    Spouse name: Not on file   Number of children: Not on file   Years of education: Not on file   Highest education level: Not on file  Occupational History   Not on file  Tobacco Use   Smoking status: Former  Types: E-cigarettes    Start date: 01/30/2021    Passive exposure: Yes   Smokeless tobacco: Never  Vaping Use   Vaping Use: Former  Substance and Sexual Activity   Alcohol use: Not Currently    Comment: rare   Drug use: Not Currently    Types: Marijuana    Comment: last use today 24 Jun 2021   Sexual activity: Yes    Partners: Male    Birth control/protection: Implant  Other Topics Concern   Not on file  Social History Narrative   Not on file   Social Determinants of Health   Financial Resource Strain: Not on file  Food Insecurity: No Food Insecurity (05/24/2021)   Hunger Vital Sign    Worried About Running Out of Food in the Last Year: Never true    Ran Out of Food in the Last Year: Never true  Recent Concern: Food Insecurity - Food Insecurity Present (04/23/2021)   Hunger Vital Sign    Worried About Running  Out of Food in the Last Year: Sometimes true    Ran Out of Food in the Last Year: Sometimes true  Transportation Needs: No Transportation Needs (05/24/2021)   PRAPARE - Hydrologist (Medical): No    Lack of Transportation (Non-Medical): No  Physical Activity: Not on file  Stress: Not on file  Social Connections: Not on file      Family History  Problem Relation Age of Onset   Diabetes Mother    Diabetes Paternal Grandmother       ROS:   Please see the history of present illness.    Review of Systems  Constitutional:  Negative for malaise/fatigue.  Respiratory:  Negative for shortness of breath.   Cardiovascular:  Negative for chest pain, palpitations, orthopnea, claudication, leg swelling and PND.  Neurological:  Negative for dizziness and loss of consciousness.    All other systems reviewed and are negative.   Labs/EKG Reviewed:    EKG:   EKG 12/23/21: NSR  Recent Labs: 07/27/2021: ALT 14 12/21/2021: Hemoglobin 11.6; Platelets 391 01/03/2022: BUN 14; Creatinine, Ser 0.77; Potassium 4.4; Sodium 140; TSH 1.680   Recent Lipid Panel No results found for: "CHOL", "TRIG", "HDL", "CHOLHDL", "LDLCALC", "LDLDIRECT"  Physical Exam:    VS:  There were no vitals taken for this visit.    Wt Readings from Last 3 Encounters:  12/27/21 226 lb (102.5 kg)  12/21/21 213 lb (96.6 kg)  11/12/21 231 lb (104.8 kg)     GEN:  Well nourished, well developed in no acute distress HEENT: Normal NECK: No JVD; No carotid bruits CARDIAC: RRR, no murmurs, rubs, gallops RESPIRATORY:  Clear to auscultation without rales, wheezing or rhonchi  ABDOMEN: Soft, non-tender, non-distended MUSCULOSKELETAL:  No edema; No deformity  SKIN: Warm and dry NEUROLOGIC:  Alert and oriented x 3 PSYCHIATRIC:  Normal affect       ASSESSMENT & PLAN:    #HTN: #Severe Pre-eclampsia in Pregnancy: Patient with severe pre-eclampsia requiring admission and early delivery at 27w via  c-section. BP remained persistently elevated in the post-partum period. Secondary work-up including TSH, renin/aldo levels, renal artery dopplers normal. Currently, *** -Continue amlodipine 5mg  daily -Not a great candidate for HCTZ due to hypokalemia -Check renal dopplers, renin/aldo, TSH and BMET -If recurrent LE edema with amlodipine, can switch to ARB however would need to change back to labetalol/nifedipine if she were to become pregnant again -She is on nexplanon for birth control which can cause  HTN; can consider switching contraceptive agent -Discussed importance of BP control to help prevent future complications including CV disease as well as neurologic and renal complications  There are no Patient Instructions on file for this visit.    Dispo:  No follow-ups on file.   Medication Adjustments/Labs and Tests Ordered: Current medicines are reviewed at length with the patient today.  Concerns regarding medicines are outlined above.  Tests Ordered: No orders of the defined types were placed in this encounter.  Medication Changes: No orders of the defined types were placed in this encounter.

## 2022-02-07 ENCOUNTER — Ambulatory Visit: Payer: Medicaid Other | Admitting: Cardiology

## 2022-07-31 ENCOUNTER — Telehealth: Payer: Self-pay | Admitting: Cardiology

## 2022-07-31 NOTE — Telephone Encounter (Signed)
Pt is calling in to ask Dr. Shari Prows if she should restart back taking her amlodipine due to headaches she's been having.   Pt last seen at [facility] back in May of 2023 for post partum HTN.   Pt was suppose to see Dr. Shari Prows back in June for routine follow-up, but that appt was cancelled.   Pt has not called the office to reschedule her appts since then or with any BP issues.   Pt states as of yesterday, she started getting a headache and tingling on one side of her face.  She denied any other cardiac complaints.  She states she has no way to monitor her numbers at home, due to cost issues.   She states she stopped taking amlodipine for a couple months because she was getting migraines and wanted to rule this medication out as not being the cause of her migraines.  She states it got better for a day or two and migraines came back.  She never resumed back on amlodipine and has been off of this since then.   Pt states she does have amlodipine on hand with refills.    She states she would like to get back into our office asap to address BP management.   Scheduled the pt to come into the office and see Jari Favre PA-C for next Monday 12/18 at 0825.  She is aware to arrive 15 mins prior to this appt and that this appt will be at our [facility] location.  Advised her to restart back taking her amlodipine today.  Advised her to watch her salt intake over the weekend and wear her compression stockings during the day if she has some.  Advised her to hydrate with water.  Advised her to inquire from [facility] if she would qualify to receive a BP cuff from our office, when seeing her on Monday.   Pt aware that I will also forward this plan to Dr. Shari Prows.  ED precautions provided.   Pt verbalized understanding and agrees with this plan.

## 2022-07-31 NOTE — Telephone Encounter (Signed)
Patient states last night she developed a tinging feeling in her face. The tingling lasted for 1-2 hours and she developed a headache shortly thereafter. She states that she did not take any pain medication for the headache because she was unsure whether or not it would interfere with blood pressure medication. Patient would like to know if Dr. Shari Prows had recommendations as they pertain to last night's episode and pain medication options.

## 2022-08-01 ENCOUNTER — Telehealth: Payer: Self-pay | Admitting: *Deleted

## 2022-08-01 NOTE — Telephone Encounter (Signed)
LMOM regarding time change for appt from 8:25 AM to 8:50 AM with Jari Favre, PA-C on 08/04/2022.

## 2022-08-03 NOTE — Progress Notes (Unsigned)
Office Visit    Patient Name: Julia Phillips Date of Encounter: 08/03/2022  PCP:  Center, [facility]   [facility]  Cardiologist:  Meriam Sprague, MD  Advanced Practice Provider:  No care team member to display Electrophysiologist:  None   HPI    Julia Phillips is a 21 y.o. female with a past medical history of for hypertension, severe preeclampsia at 25 weeks presents today for follow-up appointment.  She developed severe preeclampsia [redacted] weeks pregnant requiring transfer to [facility] due to NICU capacity.  She ultimately delivered at 27 weeks 2 days via C-section.  She was discharged on labetalol and Procardia.  She was seen by Dr. Alvester Morin where BP remained elevated.  She was last seen by Dr. Shari Prows 12/27/2021 and at that time she was overall feeling well.  She had no known pre-existing hypertension prior to pregnancy.  She was diagnosed with gestational hypertension around 20 weeks.  Delivered her baby 07/12/2022.  Since that time she had persistently elevated blood pressures.  Placed on Nifedi but developed lower extremity edema so stopped this medication.  Family history of hypertension in her mother was diagnosed at a young age.pine currently on Nexplanon for birth control.  No significant NSAID use.  Today, she***  Past Medical History    Past Medical History:  Diagnosis Date   Depression    Pregnancy induced hypertension    Scoliosis    Past Surgical History:  Procedure Laterality Date   CESAREAN SECTION      Allergies  No Known Allergies   EKGs/Labs/Other Studies Reviewed:   The following studies were reviewed today: ABDOMINAL VISCERAL  Patient Name:  Julia Phillips  Date of Exam:   01/14/2022 Medical Rec #: [id number]          Accession #:    [id number] Date of Birth: 25-Feb-2001         Patient Gender: F Patient Age:   20 years Exam Location:  [facility] Procedure:      VAS US RENAL ARTERY DUPLEX Referring  Phys: [id number] HEATHER E PEMBERTON   --------------------------------------------------------------------------- -----  Indications: Patient developed severe pre-eclampsia at 25 weeks requiring              transfer to [facility] due to NICU capacity. She was ultimately              delivered at 27w2 days via c-section. She was discharged on              labetalol and procardia. No known pre-existing hypertension prior              to pregnancy but developed gestational hypertension around              20w.Since that time, she has had persistently elevated blood              pressures.  High Risk Factors: Past history of smoking.  Comparison Study: NA  Performing Technologist: Jeryl Columbia RDCS  Supporting Technologist: Tyna Jaksch RVT    Examination Guidelines: A complete evaluation includes B-mode imaging, spectral Doppler, color Doppler, and power Doppler as needed of all accessible portions of each vessel. Bilateral testing is considered an integral part of a complete examination. Limited examinations for reoccurring indications may be performed as noted.    Duplex Findings: +--------------------+--------+--------+------+--------+ Mesenteric          PSV cm/sEDV cm/sPlaqueComments +--------------------+--------+--------+------+--------+ Aorta Prox  180                          +--------------------+--------+--------+------+--------+ Aorta Distal          134                          +--------------------+--------+--------+------+--------+ Celiac Artery Origin  169                          +--------------------+--------+--------+------+--------+ SMA Origin            142      14                  +--------------------+--------+--------+------+--------+ SMA Proximal          222      20                  +--------------------+--------+--------+------+--------+     +------------------+--------+--------+-------+ Right Renal ArteryPSV cm/sEDV cm/sComment +------------------+--------+--------+-------+ Origin              159      39           +------------------+--------+--------+-------+ Proximal            152      54           +------------------+--------+--------+-------+ Mid                 138      51           +------------------+--------+--------+-------+ Distal               77      30           +------------------+--------+--------+-------+  +-----------------+--------+--------+-------+ Left Renal ArteryPSV cm/sEDV cm/sComment +-----------------+--------+--------+-------+ Origin             221      45           +-----------------+--------+--------+-------+ Proximal           174      45           +-----------------+--------+--------+-------+ Mid                172      42           +-----------------+--------+--------+-------+ Distal             103      24           +-----------------+--------+--------+-------+  +------------+--------+--------+----+-----------+--------+--------+----+ Right KidneyPSV cm/sEDV cm/sRI  Left KidneyPSV cm/sEDV cm/sRI   +------------+--------+--------+----+-----------+--------+--------+----+ Upper Pole  25      10      0.61Upper Pole 31      12      0.62 +------------+--------+--------+----+-----------+--------+--------+----+ Mid         34      12      0.        38      16      0.59 +------------+--------+--------+----+-----------+--------+--------+----+ Lower Pole  22      10      0.53Lower Pole 23      10      0.55 +------------+--------+--------+----+-----------+--------+--------+----+ Hilar       81      28      0.65Hilar      62      21  0.66 +------------+--------+--------+----+-----------+--------+--------+----+  +------------------+-------+------------------+-------+ Right Kidney              Left Kidney               +------------------+-------+------------------+-------+ RAR                      RAR                       +------------------+-------+------------------+-------+ RAR (manual)      0.88   RAR (manual)      1.23    +------------------+-------+------------------+-------+ Cortex                   Cortex                    +------------------+-------+------------------+-------+ Cortex thickness  9.80 mmCorex thickness   9.70 mm +------------------+-------+------------------+-------+ Kidney length (cm)11.21  Kidney length (cm)11.23   +------------------+-------+------------------+-------+    Summary: Renal:   Right: 1-59% stenosis of the right renal artery. Normal size right        kidney. Normal right Resistive Index. Normal cortical        thickness of right kidney. RRV flow present. Left:  1-59% stenosis of the left renal artery. Normal size of left        kidney. Normal left Resistive Index. Normal cortical        thickness of the left kidney. LRV flow present. Mesenteric: Normal Superior Mesenteric artery and Celiac artery findings.   Patent IVC *See table(s) above for measurements and observations.   Diagnosing physician: Dina Rich MD   Electronically signed by Dina Rich MD on 01/14/2022 at 12:24:56 PM.    EKG:  EKG is *** ordered today.  The ekg ordered today demonstrates ***  Recent Labs: 12/21/2021: Hemoglobin 11.6; Platelets 391 01/03/2022: BUN 14; Creatinine, Ser 0.77; Potassium 4.4; Sodium 140; TSH 1.680  Recent Lipid Panel No results found for: "CHOL", "TRIG", "HDL", "CHOLHDL", "VLDL", "LDLCALC", "LDLDIRECT"  Risk Assessment/Calculations:  {Does this patient have ATRIAL FIBRILLATION?:[phone]}  Home Medications   No outpatient medications have been marked as taking for the 08/04/22 encounter (Appointment) with Sharlene Dory, PA-C.     Review of Systems   ***   All other systems reviewed and  are otherwise negative except as noted above.  Physical Exam    VS:  There were no vitals taken for this visit. , BMI There is no height or weight on file to calculate BMI.  Wt Readings from Last 3 Encounters:  12/27/21 226 lb (102.5 kg)  12/21/21 213 lb (96.6 kg)  11/12/21 231 lb (104.8 kg)     GEN: Well nourished, well developed, in no acute distress. HEENT: normal. Neck: Supple, no JVD, carotid bruits, or masses. Cardiac: ***RRR, no murmurs, rubs, or gallops. No clubbing, cyanosis, edema.  ***Radials/PT 2+ and equal bilaterally.  Respiratory:  ***Respirations regular and unlabored, clear to auscultation bilaterally. GI: Soft, nontender, nondistended. MS: No deformity or atrophy. Skin: Warm and dry, no rash. Neuro:  Strength and sensation are intact. Psych: Normal affect.  Assessment & Plan    Hypertension Severe preeclampsia in pregnancy  No BP recorded.  {Refresh Note OR Click here to enter BP  :1}***      Disposition: Follow up {follow up:15908} with Meriam Sprague, MD or APP.  Signed, Sharlene Dory, PA-C 08/03/2022, 3:05 PM [facility]

## 2022-08-04 ENCOUNTER — Ambulatory Visit: Payer: Medicaid Other | Admitting: Physician Assistant

## 2022-08-04 DIAGNOSIS — I10 Essential (primary) hypertension: Secondary | ICD-10-CM

## 2022-08-04 DIAGNOSIS — O1410 Severe pre-eclampsia, unspecified trimester: Secondary | ICD-10-CM

## 2022-08-06 NOTE — Progress Notes (Unsigned)
Office Visit    Patient Name: Julia Phillips Date of Encounter: 08/07/2022  PCP:  [facility], [facility]   [facility]  Cardiologist:  Meriam Sprague, MD  Advanced Practice Provider:  No care team member to display Electrophysiologist:  None   HPI    Julia Phillips is a 21 y.o. female with a past medical history of for hypertension, severe preeclampsia at 25 weeks presents today for follow-up appointment.  She developed severe preeclampsia [redacted] weeks pregnant requiring transfer to [facility] due to NICU capacity.  She ultimately delivered at 27 weeks 2 days via C-section.  She was discharged on labetalol and Procardia.  She was seen by Dr. Alvester Morin where BP remained elevated.  She was last seen by Dr. Shari Prows 12/27/2021 and at that time she was overall feeling well.  She had no known pre-existing hypertension prior to pregnancy.  She was diagnosed with gestational hypertension around 20 weeks.  Delivered her baby 07/12/2021  Since that time she had persistently elevated blood pressures.  Placed on Nifedi but developed lower extremity edema so stopped this medication.  Family history of hypertension in her mother was diagnosed at a young age.pine currently on Nexplanon for birth control.  No significant NSAID use.  Today, she tells me that she stopped taking her amlodipine and she was having some tingling in her face which mostly happen at night.  She has stopped taking it due to headaches.  She was advised to restart it and the tingling has subsided and the headaches she states are not that bad.  I offered her a lower dose but she prefers to stick with 5 mg for now.  We have provided her with an MR on blood pressure cuff today and asked her to take her blood pressure an hour after morning medications as well as her heart rate.  Her heart rate sounded slightly elevated to me on exam today.  She denied any palpitations, chest pain, shortness of breath, or  skipped beats.  Reports no shortness of breath nor dyspnea on exertion. Reports no chest pain, pressure, or tightness. No edema, orthopnea, PND. Reports no palpitations.    Past Medical History    Past Medical History:  Diagnosis Date   Depression    Pregnancy induced hypertension    Scoliosis    Past Surgical History:  Procedure Laterality Date   CESAREAN SECTION      Allergies  No Known Allergies   EKGs/Labs/Other Studies Reviewed:   The following studies were reviewed today: ABDOMINAL VISCERAL  Patient Name:  ALMA MOHIUDDIN  Date of Exam:   01/14/2022 Medical Rec #: [id number]          Accession #:    [id number] Date of Birth: [birth year]         Patient Gender: F Patient Age:   20 years Exam Location:  [facility] Procedure:      VAS US RENAL ARTERY DUPLEX Referring Phys: [id number] HEATHER E PEMBERTON   --------------------------------------------------------------------------- -----  Indications: Patient developed severe pre-eclampsia at 25 weeks requiring              transfer to [facility] due to NICU capacity. She was ultimately              delivered at 27w2 days via c-section. She was discharged on              labetalol and procardia. No known pre-existing hypertension prior  to pregnancy but developed gestational hypertension around              20w.Since that time, she has had persistently elevated blood              pressures.  High Risk Factors: Past history of smoking.  Comparison Study: NA  Performing Technologist: Leavy Cella RDCS  Supporting Technologist: Sharlett Iles RVT    Examination Guidelines: A complete evaluation includes B-mode imaging, spectral Doppler, color Doppler, and power Doppler as needed of all accessible portions of each vessel. Bilateral testing is considered an integral part of a complete examination. Limited examinations for reoccurring indications may be performed as noted.    Duplex  Findings: +--------------------+--------+--------+------+--------+ Mesenteric          PSV cm/sEDV cm/sPlaqueComments +--------------------+--------+--------+------+--------+ Aorta Prox            180                          +--------------------+--------+--------+------+--------+ Aorta Distal          134                          +--------------------+--------+--------+------+--------+ Celiac Artery Origin  169                          +--------------------+--------+--------+------+--------+ SMA Origin            142      14                  +--------------------+--------+--------+------+--------+ SMA Proximal          222      20                  +--------------------+--------+--------+------+--------+    +------------------+--------+--------+-------+ Right Renal ArteryPSV cm/sEDV cm/sComment +------------------+--------+--------+-------+ Origin              159      39           +------------------+--------+--------+-------+ Proximal            152      54           +------------------+--------+--------+-------+ Mid                 138      51           +------------------+--------+--------+-------+ Distal               77      30           +------------------+--------+--------+-------+  +-----------------+--------+--------+-------+ Left Renal ArteryPSV cm/sEDV cm/sComment +-----------------+--------+--------+-------+ Origin             221      45           +-----------------+--------+--------+-------+ Proximal           174      45           +-----------------+--------+--------+-------+ Mid                172      42           +-----------------+--------+--------+-------+ Distal             103      24           +-----------------+--------+--------+-------+  +------------+--------+--------+----+-----------+--------+--------+----+ Right KidneyPSV cm/sEDV cm/sRI  Left  KidneyPSV  cm/sEDV cm/sRI   +------------+--------+--------+----+-----------+--------+--------+----+ Upper Pole  25      10      0.61Upper Pole 31      12      0.62 +------------+--------+--------+----+-----------+--------+--------+----+ Mid         34      12      0.64Mid        38      16      0.59 +------------+--------+--------+----+-----------+--------+--------+----+ Lower Pole  22      10      0.53Lower Pole 23      10      0.55 +------------+--------+--------+----+-----------+--------+--------+----+ Hilar       81      28      0.65Hilar      62      21      0.66 +------------+--------+--------+----+-----------+--------+--------+----+  +------------------+-------+------------------+-------+ Right Kidney             Left Kidney               +------------------+-------+------------------+-------+ RAR                      RAR                       +------------------+-------+------------------+-------+ RAR (manual)      0.88   RAR (manual)      1.23    +------------------+-------+------------------+-------+ Cortex                   Cortex                    +------------------+-------+------------------+-------+ Cortex thickness  9.80 mmCorex thickness   9.70 mm +------------------+-------+------------------+-------+ Kidney length (cm)11.21  Kidney length (cm)11.23   +------------------+-------+------------------+-------+    Summary: Renal:   Right: 1-59% stenosis of the right renal artery. Normal size right        kidney. Normal right Resistive Index. Normal cortical        thickness of right kidney. RRV flow present. Left:  1-59% stenosis of the left renal artery. Normal size of left        kidney. Normal left Resistive Index. Normal cortical        thickness of the left kidney. LRV flow present. Mesenteric: Normal Superior Mesenteric artery and Celiac artery findings.   Patent IVC *See table(s) above for  measurements and observations.   Diagnosing physician: Carlyle Dolly MD   Electronically signed by Carlyle Dolly MD on 01/14/2022 at 12:24:56 PM.    EKG:  EKG is not ordered today.    Recent Labs: 12/21/2021: Hemoglobin 11.6; Platelets 391 01/03/2022: BUN 14; Creatinine, Ser 0.77; Potassium 4.4; Sodium 140; TSH 1.680  Recent Lipid Panel No results found for: "CHOL", "TRIG", "HDL", "CHOLHDL", "VLDL", "LDLCALC", "LDLDIRECT"  Home Medications   Current Meds  Medication Sig   amLODipine (NORVASC) 5 MG tablet Take 1 tablet (5 mg total) by mouth daily.   etonogestrel (NEXPLANON) 68 MG IMPL implant 68 mg by Subdermal route.     Review of Systems      All other systems reviewed and are otherwise negative except as noted above.  Physical Exam    VS:  BP 132/70 (BP Location: Right Arm, Patient Position: Sitting, Cuff Size: Large)   Ht 5\' 6"  (1.676 m)   Wt 231 lb (104.8 kg)   BMI 37.28 kg/m  , BMI Body mass index is 37.28 kg/m.  Wt Readings from Last 3 Encounters:  08/07/22 231 lb (104.8 kg)  12/27/21 226 lb (102.5 kg)  12/21/21 213 lb (96.6 kg)     GEN: Well nourished, well developed, in no acute distress. HEENT: normal. Neck: Supple, no JVD, carotid bruits, or masses. Cardiac: RRR, no murmurs, rubs, or gallops. No clubbing, cyanosis, edema.  Radials/PT 2+ and equal bilaterally.  Respiratory:  Respirations regular and unlabored, clear to auscultation bilaterally. GI: Soft, nontender, nondistended. MS: No deformity or atrophy. Skin: Warm and dry, no rash. Neuro:  Strength and sensation are intact. Psych: Normal affect.  Assessment & Plan    Hypertension -well controlled today in the office -continue Norvasc 5mg  daily -asked her to keep track of her BP at home as well as HR -provided her with an omeron cuff today -If BP 0000000 systolic or [id number] diastolic she is to contact our office  Severe preeclampsia in pregnancy -remains on BP medications at this time -her daughter  is 90 years old at this time       Disposition: Follow up 5 months with Freada Bergeron, MD or APP.  Signed, Elgie Collard, PA-C 08/07/2022, 9:04 AM [place]

## 2022-08-07 ENCOUNTER — Ambulatory Visit: Payer: Medicaid Other | Attending: Physician Assistant | Admitting: Physician Assistant

## 2022-08-07 ENCOUNTER — Encounter: Payer: Self-pay | Admitting: Physician Assistant

## 2022-08-07 VITALS — BP 132/70 | Ht 66.0 in | Wt 231.0 lb

## 2022-08-07 DIAGNOSIS — I10 Essential (primary) hypertension: Secondary | ICD-10-CM

## 2022-08-07 DIAGNOSIS — O1410 Severe pre-eclampsia, unspecified trimester: Secondary | ICD-10-CM

## 2022-08-07 NOTE — Patient Instructions (Signed)
Medication Instructions:  Your physician recommends that you continue on your current medications as directed. Please refer to the Current Medication list given to you today.  *If you need a refill on your cardiac medications before your next appointment, please call your pharmacy*   Lab Work: None If you have labs (blood work) drawn today and your tests are completely normal, you will receive your results only by: MyChart Message (if you have MyChart) OR A paper copy in the mail If you have any lab test that is abnormal or we need to change your treatment, we will call you to review the results.   Follow-Up: At [facility], you and your health needs are our priority.  As part of our continuing mission to provide you with exceptional heart care, we have created designated Provider Care Teams.  These Care Teams include your primary Cardiologist (physician) and Advanced Practice Providers (APPs -  Physician Assistants and Nurse Practitioners) who all work together to provide you with the care you need, when you need it.  Your next appointment:   5 month(s)  The format for your next appointment:   In Person  Provider:   Meriam Sprague, MD    Other Instructions 1.Call and let us know if your headaches get worse 2.Check your blood pressure and heart rate daily, one hour after taking your morning medications, for the next two weeks, keep a log and send [place] the readings through mychart  Important Information About Sugar

## 2022-10-01 ENCOUNTER — Encounter (HOSPITAL_COMMUNITY): Payer: Self-pay

## 2022-10-01 ENCOUNTER — Ambulatory Visit (HOSPITAL_COMMUNITY)
Admission: EM | Admit: 2022-10-01 | Discharge: 2022-10-01 | Disposition: A | Discharge status: Home / Self Care | Payer: Medicaid Other | Attending: Emergency Medicine | Admitting: Emergency Medicine

## 2022-10-01 DIAGNOSIS — B349 Viral infection, unspecified: Secondary | ICD-10-CM | POA: Diagnosis present

## 2022-10-01 DIAGNOSIS — Z1152 Encounter for screening for COVID-19: Secondary | ICD-10-CM | POA: Insufficient documentation

## 2022-10-01 DIAGNOSIS — I10 Essential (primary) hypertension: Secondary | ICD-10-CM | POA: Insufficient documentation

## 2022-10-01 DIAGNOSIS — K5900 Constipation, unspecified: Secondary | ICD-10-CM | POA: Diagnosis not present

## 2022-10-01 LAB — POC URINE PREG, ED: Preg Test, Ur: NEGATIVE

## 2022-10-01 MED ORDER — DOCUSATE SODIUM 250 MG PO CAPS: 250.0000 mg | ORAL_CAPSULE | Freq: Every day | ORAL | 0 refills | Status: AC

## 2022-10-01 MED ORDER — GLYCERIN (ADULT) 2 G RE SUPP: 1.0000 | RECTAL | 0 refills | Status: AC | PRN

## 2022-10-01 MED ORDER — FIBERCON 625 MG PO TABS: 1250.0000 mg | ORAL_TABLET | Freq: Three times a day (TID) | ORAL | 0 refills | Status: AC | PRN

## 2022-10-01 NOTE — ED Triage Notes (Signed)
Pt c/o nausea, vomiting, stabbing pain in stomach, without BM x3-4 days, hot flashes,   Onset ~ 3-4 days ago   Points to bilat lower abd when referring to stomach.

## 2022-10-01 NOTE — ED Provider Notes (Signed)
[place]    CSN: [id number] Arrival date & time: 10/01/22  1720      History   Chief Complaint Chief Complaint  Patient presents with   Abdominal Pain    HPI Julia Phillips is a 22 y.o. female.   Reports abdominal started 3 days ago, some emesis. Reports not being able to have a bowel movement over the past three days, has had normal appetite and been drinking a lot fo water. Has never had this issue before. Denies current nausea, reports slight abdominal pain but not currently. Reports fatigue over the past three days as well. No recent sick contacts.   The history is provided by the patient.  Abdominal Pain Associated symptoms: constipation, fatigue, nausea and vomiting   Associated symptoms: no chest pain, no chills, no cough, no dysuria, no fever, no shortness of breath and no sore throat     Past Medical History:  Diagnosis Date   Depression    Pregnancy induced hypertension    Scoliosis     Patient Active Problem List   Diagnosis Date Noted   Essential hypertension 11/12/2021   Preeclampsia, severe 11/04/2021   Scoliosis 03/29/2021    Past Surgical History:  Procedure Laterality Date   CESAREAN SECTION      OB History     Gravida  1   Para  1   Term  0   Preterm  1   AB  0   Living  1      SAB  0   IAB  0   Ectopic  0   Multiple  0   Live Births  1            Home Medications    Prior to Admission medications   Medication Sig Start Date End Date Taking? Authorizing Provider  docusate sodium (COLACE) 250 MG capsule Take 1 capsule (250 mg total) by mouth daily. 10/01/22  Yes Louretta Shorten, [place], FNP  glycerin adult 2 g suppository Place 1 suppository rectally as needed for constipation. 10/01/22  Yes Louretta Shorten, [place], FNP  polycarbophil (FIBERCON) 625 MG tablet Take 2 tablets (1,250 mg total) by mouth 3 (three) times daily as needed for up to 10 days for moderate constipation (2 tablets orally, chew and swallow  1-4 times daily as needed up to a maximum of 8 tablets/day). 10/01/22 10/11/22 Yes Louretta Shorten, [place], FNP  amLODipine (NORVASC) 5 MG tablet Take 1 tablet (5 mg total) by mouth daily. 12/27/21   Freada Bergeron, MD  etonogestrel (NEXPLANON) 68 MG IMPL implant 68 mg by Subdermal route.    [provider]    Family History Family History  Problem Relation Age of Onset   Diabetes Mother    Diabetes Paternal Grandmother     Social History Social History   Tobacco Use   Smoking status: Former    Types: E-cigarettes    Start date: 01/30/2021    Passive exposure: Yes   Smokeless tobacco: Never  Vaping Use   Vaping Use: Former  Substance Use Topics   Alcohol use: Not Currently    Comment: rare   Drug use: Not Currently    Types: Marijuana    Comment: last use today 24 Jun 2021     Allergies   Patient has no known allergies.   Review of Systems Review of Systems  Constitutional:  Positive for fatigue. Negative for chills and fever.  HENT:  Negative for sore throat.  Respiratory:  Negative for cough and shortness of breath.   Cardiovascular:  Negative for chest pain.  Gastrointestinal:  Positive for abdominal pain, constipation, nausea and vomiting.  Genitourinary:  Negative for dysuria.  Musculoskeletal:  Negative for arthralgias.     Physical Exam Triage Vital Signs ED Triage Vitals  Enc Vitals Group     BP 10/01/22 1747 (!) 156/99     Pulse Rate 10/01/22 1745 (!) 110     Resp 10/01/22 1745 18     Temp 10/01/22 1745 98.4 F (36.9 C)     Temp Source 10/01/22 1745 Oral     SpO2 10/01/22 1745 97 %     Weight --      Height --      Head Circumference --      Peak Flow --      Pain Score 10/01/22 1746 2     Pain Loc --      Pain Edu? --      Excl. in [place]? --    No data found.  Updated Vital Signs BP (!) 156/99 (BP Location: Right Arm)   Pulse (!) 107   Temp 98.4 F (36.9 C) (Oral)   Resp 18   SpO2 97%   Breastfeeding No   Visual  Acuity Right Eye Distance:   Left Eye Distance:   Bilateral Distance:    Right Eye Near:   Left Eye Near:    Bilateral Near:     Physical Exam Vitals and nursing note reviewed.  Constitutional:      Appearance: Normal appearance. She is well-developed.     Comments: Pleasant 22 year old female who appears stated age.  HENT:     Head: Normocephalic and atraumatic.     Mouth/Throat:     Mouth: Mucous membranes are moist.  Cardiovascular:     Rate and Rhythm: Normal rate and regular rhythm.     Heart sounds: Normal heart sounds, S1 normal and S2 normal.  Pulmonary:     Effort: Pulmonary effort is normal. No respiratory distress.     Breath sounds: Normal breath sounds. No wheezing.     Comments: Lungs vesicular posteriorly. Abdominal:     General: Abdomen is flat. Bowel sounds are decreased.     Palpations: Abdomen is soft.     Tenderness: There is abdominal tenderness in the right lower quadrant and left lower quadrant. There is no guarding or rebound. Negative signs include Murphy's sign, Rovsing's sign and McBurney's sign.     Hernia: No hernia is present.       Comments: Diffuse hypoactive bowel sounds.  Mild bilateral lower quadrant tenderness.  Without guarding, rebound, Murphy's, Rovsing's, McBurney's.  No hernias palpated.  Neurological:     Mental Status: She is alert.  Psychiatric:        Behavior: Behavior is cooperative.      UC Treatments / Results  Labs (all labs ordered are listed, but only abnormal results are displayed) Labs Reviewed  SARS CORONAVIRUS 2 (TAT 6-24 HRS)  POC URINE PREG, ED    EKG   Radiology No results found.  Procedures Procedures (including critical care time)  Medications Ordered in UC Medications - No data to display  Initial Impression / Assessment and Plan / UC Course  I have reviewed the triage vital signs and the nursing notes.  Pertinent labs & imaging results that were available during my care of the patient were  reviewed by me and considered in my medical decision making (  see chart for details).  Patient appears to have lower quadrant abdominal pain, emesis and nausea, most likely viral in nature, requested COVID-19 testing in clinic.  Vital signs reviewed, patient has just recently taken her antihypertensive medication.  Reports her baseline blood pressure is in the 140s over 80s.  She is afebrile.  Low concern for acute abdomen.  Hypoactive bowel sounds and normal appetite increase suspicion for constipation, treatment and prescriptions provided for this.  Discussed that due to limitations in imaging at the urgent care if symptoms worsen she should seek emergency medical treatment.  Negative urine pregnancy in clinic.  Encouraged to check blood pressures daily at home and to follow-up with her primary care regarding her hypertension control.     Final Clinical Impressions(s) / UC Diagnoses   Final diagnoses:  Viral illness  Constipation, unspecified constipation type  Essential hypertension     Discharge Instructions      Overall your exam is reassuring.  We are testing you for COVID 19 today to rule out viral causes to your abdominal pain, and emesis.  These results usually are back in a day and you will get a call if they are positive.  Please start using the stool softener as well as the fiber supplement.  If you do not have a bowel movement over the next day or 2 you can try the glycerin suppository.  As discussed, there can be many causes for abdominal pain and you may need to seek immediate medical care if you develop extreme lower abdominal pain, return of nausea, vomiting, or change in condition.  Please ensure you are tracking your blood pressure at home and taking it measurements daily.  Diet, salt intake, stress, pain and many other things can influence your daily blood pressure reading.  Please follow-up closely with your primary care provider about this.     ED Prescriptions      Medication Sig Dispense Auth. Provider   glycerin adult 2 g suppository Place 1 suppository rectally as needed for constipation. 12 suppository Louretta Shorten, [place], [place]   polycarbophil (FIBERCON) 625 MG tablet Take 2 tablets (1,250 mg total) by mouth 3 (three) times daily as needed for up to 10 days for moderate constipation (2 tablets orally, chew and swallow 1-4 times daily as needed up to a maximum of 8 tablets/day). 30 tablet Louretta Shorten, [place], [place]   docusate sodium (COLACE) 250 MG capsule Take 1 capsule (250 mg total) by mouth daily. 10 capsule Burlon Centrella, [place], FNP      I have reviewed the PDMP during this encounter.   Dakwon Wenberg, [place] N, [place] 10/01/22 [phone]

## 2022-10-01 NOTE — Discharge Instructions (Addendum)
Overall your exam is reassuring.  We are testing you for COVID 19 today to rule out viral causes to your abdominal pain, and emesis.  These results usually are back in a day and you will get a call if they are positive.  Please start using the stool softener as well as the fiber supplement.  If you do not have a bowel movement over the next day or 2 you can try the glycerin suppository.  As discussed, there can be many causes for abdominal pain and you may need to seek immediate medical care if you develop extreme lower abdominal pain, return of nausea, vomiting, or change in condition.  Please ensure you are tracking your blood pressure at home and taking it measurements daily.  Diet, salt intake, stress, pain and many other things can influence your daily blood pressure reading.  Please follow-up closely with your primary care provider about this.

## 2022-10-02 LAB — SARS CORONAVIRUS 2 (TAT 6-24 HRS): SARS Coronavirus 2: NEGATIVE

## 2022-10-28 ENCOUNTER — Other Ambulatory Visit: Payer: Self-pay

## 2022-10-28 ENCOUNTER — Emergency Department (HOSPITAL_COMMUNITY)
Admission: EM | Admit: 2022-10-28 | Discharge: 2022-10-28 | Disposition: A | Discharge status: Home / Self Care | Payer: Medicaid Other | Attending: Emergency Medicine | Admitting: Emergency Medicine

## 2022-10-28 ENCOUNTER — Encounter (HOSPITAL_COMMUNITY): Payer: Self-pay

## 2022-10-28 ENCOUNTER — Emergency Department (HOSPITAL_COMMUNITY): Payer: Medicaid Other

## 2022-10-28 DIAGNOSIS — R1032 Left lower quadrant pain: Secondary | ICD-10-CM | POA: Insufficient documentation

## 2022-10-28 DIAGNOSIS — Z79899 Other long term (current) drug therapy: Secondary | ICD-10-CM | POA: Insufficient documentation

## 2022-10-28 DIAGNOSIS — R112 Nausea with vomiting, unspecified: Secondary | ICD-10-CM | POA: Insufficient documentation

## 2022-10-28 DIAGNOSIS — R109 Unspecified abdominal pain: Secondary | ICD-10-CM | POA: Diagnosis present

## 2022-10-28 DIAGNOSIS — E876 Hypokalemia: Secondary | ICD-10-CM | POA: Insufficient documentation

## 2022-10-28 DIAGNOSIS — R1012 Left upper quadrant pain: Secondary | ICD-10-CM | POA: Diagnosis not present

## 2022-10-28 DIAGNOSIS — R1084 Generalized abdominal pain: Secondary | ICD-10-CM

## 2022-10-28 DIAGNOSIS — I10 Essential (primary) hypertension: Secondary | ICD-10-CM

## 2022-10-28 DIAGNOSIS — O1410 Severe pre-eclampsia, unspecified trimester: Secondary | ICD-10-CM

## 2022-10-28 LAB — URINALYSIS, ROUTINE W REFLEX MICROSCOPIC
Bacteria, UA: NONE SEEN
Bilirubin Urine: NEGATIVE
Glucose, UA: NEGATIVE mg/dL
Hgb urine dipstick: NEGATIVE
Ketones, ur: NEGATIVE mg/dL
Leukocytes,Ua: NEGATIVE
Nitrite: NEGATIVE
Protein, ur: 30 mg/dL — AB
Specific Gravity, Urine: 1.019 (ref 1.005–1.030)
pH: 5 (ref 5.0–8.0)

## 2022-10-28 LAB — CBC WITH DIFFERENTIAL/PLATELET
Abs Immature Granulocytes: 0.01 10*3/uL (ref 0.00–0.07)
Basophils Absolute: 0 10*3/uL (ref 0.0–0.1)
Basophils Relative: 0 %
Eosinophils Absolute: 0.1 10*3/uL (ref 0.0–0.5)
Eosinophils Relative: 1 %
HCT: 38.6 % (ref 36.0–46.0)
Hemoglobin: 12.5 g/dL (ref 12.0–15.0)
Immature Granulocytes: 0 %
Lymphocytes Relative: 28 %
Lymphs Abs: 1.3 10*3/uL (ref 0.7–4.0)
MCH: 27.1 pg (ref 26.0–34.0)
MCHC: 32.4 g/dL (ref 30.0–36.0)
MCV: 83.7 fL (ref 80.0–100.0)
Monocytes Absolute: 0.7 10*3/uL (ref 0.1–1.0)
Monocytes Relative: 15 %
Neutro Abs: 2.5 10*3/uL (ref 1.7–7.7)
Neutrophils Relative %: 56 %
Platelets: 360 10*3/uL (ref 150–400)
RBC: 4.61 MIL/uL (ref 3.87–5.11)
RDW: 13.4 % (ref 11.5–15.5)
WBC: 4.6 10*3/uL (ref 4.0–10.5)
nRBC: 0 % (ref 0.0–0.2)

## 2022-10-28 LAB — COMPREHENSIVE METABOLIC PANEL
ALT: 12 U/L (ref 0–44)
AST: 16 U/L (ref 15–41)
Albumin: 4.1 g/dL (ref 3.5–5.0)
Alkaline Phosphatase: 50 U/L (ref 38–126)
Anion gap: 13 (ref 5–15)
BUN: 6 mg/dL (ref 6–20)
CO2: 23 mmol/L (ref 22–32)
Calcium: 8.8 mg/dL — ABNORMAL LOW (ref 8.9–10.3)
Chloride: 101 mmol/L (ref 98–111)
Creatinine, Ser: 0.77 mg/dL (ref 0.44–1.00)
GFR, Estimated: >60 mL/min (ref >60)
Glucose, Bld: 91 mg/dL (ref 70–99)
Potassium: 3.2 mmol/L — ABNORMAL LOW (ref 3.5–5.1)
Sodium: 137 mmol/L (ref 135–145)
Total Bilirubin: 0.7 mg/dL (ref 0.3–1.2)
Total Protein: 7.2 g/dL (ref 6.5–8.1)

## 2022-10-28 LAB — PREGNANCY, URINE: Preg Test, Ur: NEGATIVE

## 2022-10-28 LAB — LIPASE, BLOOD: Lipase: 30 U/L (ref 11–51)

## 2022-10-28 MED ORDER — POTASSIUM CHLORIDE CRYS ER 20 MEQ PO TBCR
30.0000 meq | EXTENDED_RELEASE_TABLET | Freq: Once | ORAL | Status: AC
  Administered 2022-10-28: 30 meq via ORAL
  Filled 2022-10-28: qty 1

## 2022-10-28 MED ORDER — SODIUM CHLORIDE 0.9 % IV BOLUS
1000.0000 mL | Freq: Once | INTRAVENOUS | Status: AC
  Administered 2022-10-28: 1000 mL via INTRAVENOUS

## 2022-10-28 MED ORDER — IOHEXOL 350 MG/ML SOLN
75.0000 mL | Freq: Once | INTRAVENOUS | Status: AC | PRN
  Administered 2022-10-28: 75 mL via INTRAVENOUS

## 2022-10-28 MED ORDER — ONDANSETRON HCL 4 MG PO TABS: 4.0000 mg | ORAL_TABLET | Freq: Three times a day (TID) | ORAL | 0 refills | Status: AC | PRN

## 2022-10-28 MED ORDER — AMLODIPINE BESYLATE 5 MG PO TABS: 5.0000 mg | ORAL_TABLET | Freq: Every day | ORAL | 0 refills | Status: DC

## 2022-10-28 MED ORDER — ACETAMINOPHEN 500 MG PO TABS
1000.0000 mg | ORAL_TABLET | Freq: Once | ORAL | Status: AC
Start: 2022-10-28 — End: 2022-10-28
  Administered 2022-10-28: 1000 mg via ORAL
  Filled 2022-10-28: qty 2

## 2022-10-28 MED ORDER — ONDANSETRON HCL 4 MG/2ML IJ SOLN: 4.0000 mg | Freq: Once | INTRAMUSCULAR | Status: DC

## 2022-10-28 NOTE — Discharge Instructions (Addendum)
It was a pleasure taking care of you today!  There were no concerning emergent findings on your lab and imaging studies tonight.  You will be sent a prescription for Zofran, take as directed for nausea or vomiting.  If you are experiencing nausea or vomiting wait at least 30 minutes before you attempt small sips/small bites of food/fluid.  Ensure to maintain fluid intake with water, tea, broth, Gatorade, Pedialyte.  Follow-up with your primary care provider regarding today's ED visit.  Return to the emergency department if you experience increasing/worsening symptoms.

## 2022-10-28 NOTE — ED Provider Notes (Signed)
[facility] Provider Note   CSN: [id number] Arrival date & time: 10/28/22  1552     History  Chief Complaint  Patient presents with   Abdominal Pain   Emesis    Julia Phillips is a 22 y.o. female who presents emergency department with concerns for abdominal pain. Has associated nausea, vomiting.  Denies recent NSAID use, EtOH, no anticoagulant use at this time.  Denies vaginal bleeding, vaginal discharge, urinary symptoms, diarrhea, constipation.  Patient's last bowel movement was today.  Patient still has her gallbladder and appendix.  The history is provided by the patient. No language interpreter was used.       Home Medications Prior to Admission medications   Medication Sig Start Date End Date Taking? Authorizing Provider  ondansetron (ZOFRAN) 4 MG tablet Take 1 tablet (4 mg total) by mouth every 8 (eight) hours as needed for nausea or vomiting. 10/28/22  Yes Kenyatta Keidel A, PA-C  amLODipine (NORVASC) 5 MG tablet Take 1 tablet (5 mg total) by mouth daily. 10/28/22   Freada Bergeron, MD  docusate sodium (COLACE) 250 MG capsule Take 1 capsule (250 mg total) by mouth daily. 10/01/22   Garrison, [place], FNP  etonogestrel (NEXPLANON) 68 MG IMPL implant 68 mg by Subdermal route.    [provider]  glycerin adult 2 g suppository Place 1 suppository rectally as needed for constipation. 10/01/22   Garrison, [place] N, FNP      Allergies    Patient has no known allergies.    Review of Systems   Review of Systems  Gastrointestinal:  Positive for abdominal pain.  All other systems reviewed and are negative.   Physical Exam Updated Vital Signs BP (!) 149/87 (BP Location: Right Arm)   Pulse 94   Temp 98.8 F (37.1 C) (Oral)   Resp 20   Ht '5\' 5"'$  (1.651 m)   Wt 104.8 kg   SpO2 98%   BMI 38.44 kg/m  Physical Exam Vitals and nursing note reviewed.  Constitutional:      General: She is not in acute distress.     Appearance: She is not diaphoretic.  HENT:     Head: Normocephalic and atraumatic.     Mouth/Throat:     Pharynx: No oropharyngeal exudate.  Eyes:     General: No scleral icterus.    Conjunctiva/sclera: Conjunctivae normal.  Cardiovascular:     Rate and Rhythm: Normal rate and regular rhythm.     Pulses: Normal pulses.     Heart sounds: Normal heart sounds.  Pulmonary:     Effort: Pulmonary effort is normal. No respiratory distress.     Breath sounds: Normal breath sounds. No wheezing.  Abdominal:     General: Bowel sounds are normal.     Palpations: Abdomen is soft. There is no mass.     Tenderness: There is abdominal tenderness in the suprapubic area, left upper quadrant and left lower quadrant. There is no guarding or rebound.     Comments: Tenderness to palpation noted to left sided abdomen and suprapubic region.  Musculoskeletal:        General: Normal range of motion.     Cervical back: Normal range of motion and neck supple.  Skin:    General: Skin is warm and dry.  Neurological:     Mental Status: She is alert.  Psychiatric:        Behavior: Behavior normal.     ED Results /  Procedures / Treatments   Labs (all labs ordered are listed, but only abnormal results are displayed) Labs Reviewed  COMPREHENSIVE METABOLIC PANEL - Abnormal; Notable for the following components:      Result Value   Potassium 3.2 (*)    Calcium 8.8 (*)    All other components within normal limits  URINALYSIS, ROUTINE W REFLEX MICROSCOPIC - Abnormal; Notable for the following components:   APPearance HAZY (*)    Protein, ur 30 (*)    All other components within normal limits  LIPASE, BLOOD  CBC WITH DIFFERENTIAL/PLATELET  PREGNANCY, URINE    EKG None  Radiology CT ABDOMEN PELVIS W CONTRAST  Result Date: 10/28/2022 CLINICAL DATA:  Abdominal pain and vomiting EXAM: CT ABDOMEN AND PELVIS WITH CONTRAST TECHNIQUE: Multidetector CT imaging of the abdomen and pelvis was performed using the  standard protocol following bolus administration of intravenous contrast. RADIATION DOSE REDUCTION: This exam was performed according to the departmental dose-optimization program which includes automated exposure control, adjustment of the mA and/or kV according to patient size and/or use of iterative reconstruction technique. CONTRAST:  60m OMNIPAQUE IOHEXOL 350 MG/ML SOLN COMPARISON:  None Available. FINDINGS: Lower chest: No acute abnormality. Hepatobiliary: Liver, gallbladder, and biliary tree are unremarkable. Pancreas: Unremarkable. Spleen: Unremarkable. Adrenals/Urinary Tract: Normal adrenal glands. No urinary calculi or hydronephrosis. Unremarkable bladder. Stomach/Bowel: Normal caliber large and small bowel. No bowel wall thickening. Normal appendix. Unremarkable stomach. Vascular/Lymphatic: No significant vascular findings are present. No enlarged abdominal or pelvic lymph nodes. Reproductive: Uterus and bilateral adnexa are unremarkable. Other: Small volume free fluid in the pelvis is likely physiologic. No free intraperitoneal air. No abdominal wall hernia. Musculoskeletal: No acute osseous abnormality. IMPRESSION: No acute abnormality in the abdomen or pelvis. Electronically Signed   By: TPlacido SouM.D.   On: 10/28/2022 23:15    Procedures Procedures    Medications Ordered in ED Medications  ondansetron (ZOFRAN) injection 4 mg (has no administration in time range)  potassium chloride (KLOR-CON M) CR tablet 30 mEq (30 mEq Oral Given 10/28/22 2136)  sodium chloride 0.9 % bolus 1,000 mL (0 mLs Intravenous Stopped 10/28/22 2307)  acetaminophen (TYLENOL) tablet 1,000 mg (1,000 mg Oral Given 10/28/22 2136)  iohexol (OMNIPAQUE) 350 MG/ML injection 75 mL (75 mLs Intravenous Contrast Given 10/28/22 2306)    ED Course/ Medical Decision Making/ A&P Clinical Course as of 10/28/22 2335  Tue Oct 28, 2022  2320 Patient reevaluated resting comfortably on the stretcher.  Patient notes at this time  she is experiencing some nausea.  Antiemetic ordered at this time.  Discussed with patient lab and imaging findings.  Discussed with patient discharge treatment plan.  Answered all available questions.  Patient appears safe for discharge at this time. [SB]    Clinical Course User Index [SB] Jenniferann Stuckert A, PA-C                             Medical Decision Making Amount and/or Complexity of Data Reviewed Radiology: ordered.  Risk OTC drugs. Prescription drug management.   Patient presents to the emergency department with abdominal pain and vomiting.  Notes has been going on for several days. On exam patient with tenderness to palpation noted to left sided and suprapubic region.  Remainder of exam without acute findings.  Pt afebrile. Differential diagnosis includes pancreatitis, cholecystitis, appendicitis, diverticulitis, acute cystitis.  Labs:  I ordered, and personally interpreted labs.  The pertinent results include:  Lipase negative  CBC without leukocytosis CMP with slightly decreased potassium at 3.2 otherwise unremarkable Pregnancy urine negative Urinalysis negative for acute cystitis  Imaging: I ordered imaging studies including CT abdomen pelvis with I independently visualized and interpreted imaging which showed no acute intra-abdominal abnormality I agree with the radiologist interpretation  Medications:  I ordered medication including IVF, Zofran, and Tylenol for symptom management.  Reevaluation of the patient after these medicines and interventions, I reevaluated the patient and found that they have improved I have reviewed the patients home medicines and have made adjustments as needed Tolerated PO challenge with sandwich and fluids in ED with above treatment regimen    Disposition: Presentation suspicious for likely viral etiology as cause of patient's symptoms.  Doubt concerns at this time for diverticulitis, acute cystitis, pancreatitis, cholecystitis,  appendicitis. After consideration of the diagnostic results and the patients response to treatment, I feel that the patient would benefit from Discharge home.  Zofran prescription sent to patient's pharmacy.  Patient instructed to follow-up with primary care provider regarding today's ED visit. Supportive care measures and strict return precautions discussed with patient at bedside. Pt acknowledges and verbalizes understanding. Pt appears safe for discharge. Follow up as indicated in discharge paperwork.   This chart was dictated using voice recognition software, Dragon. Despite the best efforts of this provider to proofread and correct errors, errors may still occur which can change documentation meaning.   Final Clinical Impression(s) / ED Diagnoses Final diagnoses:  Nausea and vomiting, unspecified vomiting type  Generalized abdominal pain    Rx / DC Orders ED Discharge Orders          Ordered    ondansetron (ZOFRAN) 4 MG tablet  Every 8 hours PRN        10/28/22 2334              Arvie Villarruel A, PA-C 10/28/22 2335    Malvin Johns, MD 10/28/22 2348

## 2022-10-28 NOTE — ED Provider Triage Note (Signed)
Emergency Medicine Provider Triage Evaluation Note  Julia Phillips , a 22 y.o. female  was evaluated in triage.  Pt complains of abdominal pain and vomiting.  She reports that she has had several days of upper referred abdominal pain with brief vomiting she describes as dark in color.  Denies any recent NSAID use, alcohol use.  Not currently on blood thinners.  Currently denies any lower abdominal symptoms such as right lower quadrant or left lower quadrant abdominal pain, suprapubic tenderness, dysuria, hematuria.  Does not believe that she is pregnant at this time as she uses oral contraceptive.  Review of Systems  Positive: As above Negative: As above  Physical Exam  BP (!) 137/96   Pulse (!) 111   Temp 98.3 F (36.8 C) (Oral)   Resp 16   Ht '5\' 5"'$  (1.651 m)   Wt 104.8 kg   SpO2 100%   BMI 38.44 kg/m  Gen:   Awake, no distress   Resp:  Normal effort  MSK:   Moves extremities without difficulty  Other:  Tenderness to palpation in upper third of abdomen  Medical Decision Making  Medically screening exam initiated at 4:29 PM.  Appropriate orders placed.  Senaida Ores was informed that the remainder of the evaluation will be completed by another provider, this initial triage assessment does not replace that evaluation, and the importance of remaining in the ED until their evaluation is complete.     Luvenia Heller, PA-C 10/28/22 1630

## 2022-10-28 NOTE — ED Triage Notes (Signed)
Pt c/o intermittent upper abdominal pain x2 weeks and nausea yesterday only.  Pain score 7/10.  Pt has not taken anything for symptoms.   Denies GU complaints.  No precipitating symptoms.

## 2023-01-08 NOTE — Progress Notes (Deleted)
Cardio-Obstetrics Clinic  New Evaluation  Date:  01/08/2023   ID:  Julia Phillips, DOB 2001-01-27, MRN [id number]  PCP:  [facility]   [facility] Providers Cardiologist:  Meriam Sprague, MD  Electrophysiologist:  None       Referring MD: [facility]   Chief Complaint: HTN  History of Present Illness:    Julia Phillips is a 22 y.o. female [G1P0101] who is presents for follow-up for management of HTN  Patient developed severe pre-eclampsia at 25 weeks requiring transfer to [facility] due to NICU capacity. She was ultimately delivered at 27w2 days via c-section. She was discharged on labetalol and procardia.   Was initially seen in clinic 12/2021 where BP was persistently elevated in the post-partum period. We changed her nifedipine to amlodipine 5mg  daily. Secondary HTN work-up revealed normal renal dopplers. Normal renin/aldo levels, TSH.  Was seen in follow-up by Tessa on 07/2022. Had stopped taking her amlodipine due to tingling in her face. She was amenable to restarting it at that time.  Today, ***   Prior CV Studies Reviewed: The following studies were reviewed today: No CV studies  Past Medical History:  Diagnosis Date   Depression    Pregnancy induced hypertension    Scoliosis     Past Surgical History:  Procedure Laterality Date   CESAREAN SECTION        OB History     Gravida  1   Para  1   Term  0   Preterm  1   AB  0   Living  1      SAB  0   IAB  0   Ectopic  0   Multiple  0   Live Births  1               Current Medications: No outpatient medications have been marked as taking for the 01/09/23 encounter (Appointment) with Meriam Sprague, MD.     Allergies:   Patient has no known allergies.   Social History   Socioeconomic History   Marital status: Significant Other    Spouse name: Not on file   Number of children: Not on file   Years of education: Not on file   Highest  education level: Not on file  Occupational History   Not on file  Tobacco Use   Smoking status: Former    Types: E-cigarettes    Start date: 01/30/2021    Passive exposure: Yes   Smokeless tobacco: Never  Vaping Use   Vaping Use: Every day  Substance and Sexual Activity   Alcohol use: Not Currently    Comment: rare   Drug use: Not Currently    Types: Marijuana    Comment: last use today 24 Jun 2021   Sexual activity: Yes    Partners: Male    Birth control/protection: Implant  Other Topics Concern   Not on file  Social History Narrative   Not on file   Social Determinants of Health   Financial Resource Strain: Not on file  Food Insecurity: No Food Insecurity (05/24/2021)   Hunger Vital Sign    Worried About Running Out of Food in the Last Year: Never true    Ran Out of Food in the Last Year: Never true  Recent Concern: Food Insecurity - Food Insecurity Present (04/23/2021)   Hunger Vital Sign    Worried About Running Out of Food in the Last Year: Sometimes true  Ran Out of Food in the Last Year: Sometimes true  Transportation Needs: No Transportation Needs (05/24/2021)   PRAPARE - Administrator, Civil Service (Medical): No    Lack of Transportation (Non-Medical): No  Physical Activity: Not on file  Stress: Not on file  Social Connections: Not on file      Family History  Problem Relation Age of Onset   Diabetes Mother    Diabetes Paternal Grandmother       ROS:   Please see the history of present illness.    Review of Systems  Constitutional:  Negative for malaise/fatigue.  Respiratory:  Negative for shortness of breath.   Cardiovascular:  Negative for chest pain, palpitations, orthopnea, claudication, leg swelling and PND.  Neurological:  Negative for dizziness and loss of consciousness.    All other systems reviewed and are negative.   Labs/EKG Reviewed:    EKG:   EKG 12/23/21: NSR  Recent Labs: 10/28/2022: ALT 12; BUN 6; Creatinine, Ser  0.77; Hemoglobin 12.5; Platelets 360; Potassium 3.2; Sodium 137   Recent Lipid Panel No results found for: "CHOL", "TRIG", "HDL", "CHOLHDL", "LDLCALC", "LDLDIRECT"  Physical Exam:    VS:  There were no vitals taken for this visit.    Wt Readings from Last 3 Encounters:  10/28/22 231 lb (104.8 kg)  08/07/22 231 lb (104.8 kg)  12/27/21 226 lb (102.5 kg)     GEN:  Well nourished, well developed in no acute distress HEENT: Normal NECK: No JVD; No carotid bruits CARDIAC: RRR, no murmurs, rubs, gallops RESPIRATORY:  Clear to auscultation without rales, wheezing or rhonchi  ABDOMEN: Soft, non-tender, non-distended MUSCULOSKELETAL:  No edema; No deformity  SKIN: Warm and dry NEUROLOGIC:  Alert and oriented x 3 PSYCHIATRIC:  Normal affect       ASSESSMENT & PLAN:    #HTN: #Severe Pre-eclampsia in Pregnancy: Patient with severe pre-eclampsia requiring admission and early delivery at 27w via c-section. BP has been persistently elevated in the post-partum period. Renal dopplers normal. TSH, aldo/renin level normal. She did not tolerate nifedipine due to LE edema. Now doing well on amlodipine. -Continue amlodipine 5mg  daily -Discussed importance of BP control to help prevent future complications including CV disease as well as neurologic and renal complications  There are no Patient Instructions on file for this visit.    Dispo:  No follow-ups on file.   Medication Adjustments/Labs and Tests Ordered: Current medicines are reviewed at length with the patient today.  Concerns regarding medicines are outlined above.  Tests Ordered: No orders of the defined types were placed in this encounter.  Medication Changes: No orders of the defined types were placed in this encounter.

## 2023-01-09 ENCOUNTER — Encounter: Payer: Self-pay | Admitting: Cardiology

## 2023-01-09 ENCOUNTER — Ambulatory Visit: Payer: Medicaid Other | Attending: Cardiology | Admitting: Cardiology

## 2023-02-02 ENCOUNTER — Other Ambulatory Visit: Payer: Self-pay | Admitting: *Deleted

## 2023-02-02 DIAGNOSIS — O1410 Severe pre-eclampsia, unspecified trimester: Secondary | ICD-10-CM

## 2023-02-02 DIAGNOSIS — I10 Essential (primary) hypertension: Secondary | ICD-10-CM

## 2023-02-02 MED ORDER — AMLODIPINE BESYLATE 5 MG PO TABS: 5.0000 mg | ORAL_TABLET | Freq: Every day | ORAL | 0 refills | Status: DC

## 2023-03-02 ENCOUNTER — Telehealth: Payer: Self-pay | Admitting: Cardiology

## 2023-03-02 MED ORDER — AMLODIPINE BESYLATE 10 MG PO TABS: 10.0000 mg | ORAL_TABLET | Freq: Every day | ORAL | 2 refills | Status: DC

## 2023-03-02 NOTE — Telephone Encounter (Signed)
  Per MyChart Scheduling message:  Pt c/o BP issue: STAT if pt c/o blurred vision, one-sided weakness or slurred speech  1. What are your last 5 BP readings?   2. Are you having any other symptoms (ex. Dizziness, headache, blurred vision, passed out)?   3. What is your BP issue?     My current BP is reading 152/95 with a pulse of 81   My symptoms so far have been a headache on the left side of my head from the left eye upwards, slight dizzy spells and blurry vision. I haven't had any sort of vomiting or passing out.   I'm ashamed to say I haven't been taking my BP as often as I should but if need be I can take it as soon as I'm done writing this and get back to you, if that's all right?   As far as issues with my BP go I've been on medication since giving birth because I had preeclampsia. I can usually tell when my BP goes up because my body will start to feel strange; the headaches will come, I feel the need to breathe slower, my vision will get blurry and I'll have to lie down for a moment.

## 2023-03-02 NOTE — Telephone Encounter (Signed)
Pt is messaging Jari Favre PA-C and Dr. Shari Prows with complaints of high BP along with other symptoms accompanied with elevated pressures.  Pts last pressure was 152/95.  She has been having dizziness, headache, some blurry vision. She has not been tracking her BP's on a daily, but can tell when her numbers elevate, for she develops symptoms.  She notes when they're elevated her whole body starts to feel strange.  She is only taking amlodipine 5 mg po daily for BP management.   Pt states she thinks her amlodipine dose should probably be increased.   She states she is tolerating amlodipine well, with no issues with lower extremity swelling like she had in the past with nifedipine.   Pt is inquiring if Jari Favre PA-C thinks she should increase her amlodipine dose from 5 mg to 10 mg daily.  She mentioned her diet is very clean with no salt intake.    She states she is going to get back on track with taking her pressures and logging this at home.  She states for about a week now the pressures are hanging out in the 150s systolic and 80s-90s diastolic.  She said at last OV with Julian Hy, she advised her to call or message the office if she notes pressures in the 140s or higher.  Pt is aware that I will forward this message to both Tessa and Dr. Shari Prows to further review and advise on.   Did inform her that Dr. Shari Prows is out of the office this entire week.  Pt verbalized understanding and agrees with this plan.

## 2023-03-02 NOTE — Telephone Encounter (Signed)
Julia Phillips D - 03/02/2023 11:01 AM Christell Constant, MD  Sent: Mon March 02, 2023 12:08 PM  To: Julia Socks, LPN; Sharlene Dory, PA-C  Cc: Meriam Sprague, MD; Valrie Hart, CMA         Message  Reasonable to do a trial of higher dose (it will take some time to see the results of 10 mg dose).  Should monitor for LE edema.     Called the pt back and informed her that we will trial her on higher dose of amlodipine 10 mg po daily.  Advised her to continue monitoring her numbers at home and monitor for any LEE on higher dose.   Confirmed the pharmacy of choice with the pt.  Pt verbalized understanding and agrees with this plan.

## 2023-06-03 ENCOUNTER — Other Ambulatory Visit: Payer: Self-pay

## 2023-06-03 MED ORDER — AMLODIPINE BESYLATE 10 MG PO TABS: 10.0000 mg | ORAL_TABLET | Freq: Every day | ORAL | 0 refills | Status: DC

## 2023-09-26 ENCOUNTER — Other Ambulatory Visit: Payer: Self-pay | Admitting: Physician Assistant

## 2023-10-06 ENCOUNTER — Ambulatory Visit: Payer: Medicaid Other | Admitting: Obstetrics and Gynecology

## 2024-09-09 ENCOUNTER — Ambulatory Visit: Admitting: Family Medicine

## 2024-09-09 ENCOUNTER — Encounter: Payer: Self-pay | Admitting: Family Medicine

## 2024-09-09 ENCOUNTER — Other Ambulatory Visit: Payer: Self-pay

## 2024-09-09 VITALS — BP 140/84 | HR 80 | Wt 272.9 lb

## 2024-09-09 DIAGNOSIS — Z3046 Encounter for surveillance of implantable subdermal contraceptive: Secondary | ICD-10-CM

## 2024-09-09 NOTE — Progress Notes (Signed)
" ° ° ° °  GYNECOLOGY OFFICE PROCEDURE NOTE  SHANEE BATCH is a 24 y.o. G1P0101 here for Nexplanon removal.    Last pap smear: Result Date Procedure Results Follow-ups  11/12/2021 Cytology - PAP( [facility]) High risk HPV: Negative Neisseria Gonorrhea: Negative Chlamydia: Negative Trichomonas: Negative Adequacy: Satisfactory for evaluation; transformation zone component ABSENT. Diagnosis: - Negative for intraepithelial lesion or malignancy (NILM) Comment: Normal Reference Range Trichomonas - Negative Comment: Normal Reference [place] Chlamydia - Negative Comment: Normal Reference Range Neisseria Gonorrhea - Negative Comment: Normal Reference Range HPV - Negative      Nexplanon removal Procedure Patient identified, informed consent performed, consent signed.   Appropriate time out taken. Nexplanon site identified in patient's left arm.  Area prepped in usual sterile fashon. One ml of 1% lidocaine  was used to anesthetize the area at the distal end of the implant. A small stab incision was made right beside the implant on the distal portion.  The Nexplanon rod was grasped using hemostats and removed without difficulty. There was minimal blood loss. There were no complications. Steri-strips were applied over the small incision. A pressure bandage was applied to reduce any bruising. The patient tolerated the procedure well and was given post procedure instructions. Patient declines other forms of birth control.   Donnice CHRISTELLA Carolus, MD  "
